# Patient Record
Sex: Female | Born: 1990 | Race: White | Hispanic: No | Marital: Married | Smoking: Former smoker
Health system: Southern US, Community
[De-identification: ages and names within clinical notes are randomized; demographics above are authoritative.]

## PROBLEM LIST (undated history)

## (undated) DIAGNOSIS — Z789 Other specified health status: Secondary | ICD-10-CM

## (undated) HISTORY — DX: Other specified health status: Z78.9

## (undated) HISTORY — PX: NO PAST SURGERIES: SHX2092

---
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
## 2015-01-06 ENCOUNTER — Encounter (HOSPITAL_COMMUNITY): Payer: Self-pay | Admitting: Emergency Medicine

## 2015-01-06 ENCOUNTER — Emergency Department (HOSPITAL_COMMUNITY)
Admission: EM | Admit: 2015-01-06 | Discharge: 2015-01-06 | Disposition: A | Discharge status: Home / Self Care | Payer: Medicaid Other | Attending: Emergency Medicine | Admitting: Emergency Medicine

## 2015-01-06 ENCOUNTER — Emergency Department (HOSPITAL_COMMUNITY): Payer: Medicaid Other

## 2015-01-06 DIAGNOSIS — Z720 Tobacco use: Secondary | ICD-10-CM | POA: Insufficient documentation

## 2015-01-06 DIAGNOSIS — Y9389 Activity, other specified: Secondary | ICD-10-CM | POA: Insufficient documentation

## 2015-01-06 DIAGNOSIS — S161XXA Strain of muscle, fascia and tendon at neck level, initial encounter: Secondary | ICD-10-CM

## 2015-01-06 DIAGNOSIS — Y998 Other external cause status: Secondary | ICD-10-CM | POA: Insufficient documentation

## 2015-01-06 DIAGNOSIS — Y92410 Unspecified street and highway as the place of occurrence of the external cause: Secondary | ICD-10-CM | POA: Insufficient documentation

## 2015-01-06 DIAGNOSIS — S0990XA Unspecified injury of head, initial encounter: Secondary | ICD-10-CM
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
## 2015-01-06 MED ORDER — IBUPROFEN 800 MG PO TABS
800.0000 mg | ORAL_TABLET | Freq: Once | ORAL | Status: AC
  Administered 2015-01-06: 800 mg via ORAL
  Filled 2015-01-06: qty 1

## 2015-01-06 MED ORDER — TRAMADOL HCL 50 MG PO TABS
50.0000 mg | ORAL_TABLET | Freq: Four times a day (QID) | ORAL | Status: DC | PRN
Start: 2015-01-06 — End: 2016-06-30

## 2015-01-06 MED ORDER — IBUPROFEN 600 MG PO TABS: 600.0000 mg | ORAL_TABLET | Freq: Four times a day (QID) | ORAL | Status: DC | PRN
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
## 2016-06-30 ENCOUNTER — Encounter (HOSPITAL_COMMUNITY): Payer: Self-pay | Admitting: Emergency Medicine

## 2016-06-30 ENCOUNTER — Emergency Department (HOSPITAL_COMMUNITY)
Admission: EM | Admit: 2016-06-30 | Discharge: 2016-06-30 | Disposition: A | Discharge status: Home / Self Care | Payer: Worker's Compensation | Attending: Emergency Medicine | Admitting: Emergency Medicine

## 2016-06-30 DIAGNOSIS — Z23 Encounter for immunization: Secondary | ICD-10-CM | POA: Insufficient documentation

## 2016-06-30 DIAGNOSIS — Y9389 Activity, other specified: Secondary | ICD-10-CM | POA: Insufficient documentation

## 2016-06-30 DIAGNOSIS — S61216A Laceration without foreign body of right little finger without damage to nail, initial encounter: Secondary | ICD-10-CM | POA: Diagnosis not present

## 2016-06-30 DIAGNOSIS — S6991XA Unspecified injury of right wrist, hand and finger(s), initial encounter: Secondary | ICD-10-CM | POA: Diagnosis present

## 2016-06-30 DIAGNOSIS — F17200 Nicotine dependence, unspecified, uncomplicated: Secondary | ICD-10-CM | POA: Diagnosis not present

## 2016-06-30 DIAGNOSIS — Y929 Unspecified place or not applicable: Secondary | ICD-10-CM | POA: Diagnosis not present

## 2016-06-30 DIAGNOSIS — Y990 Civilian activity done for income or pay: Secondary | ICD-10-CM | POA: Insufficient documentation

## 2016-06-30 DIAGNOSIS — W272XXA Contact with scissors, initial encounter: Secondary | ICD-10-CM | POA: Diagnosis not present

## 2016-06-30 MED ORDER — TETANUS-DIPHTH-ACELL PERTUSSIS 5-2.5-18.5 LF-MCG/0.5 IM SUSP
0.5000 mL | Freq: Once | INTRAMUSCULAR | Status: AC
  Administered 2016-06-30: 0.5 mL via INTRAMUSCULAR
  Filled 2016-06-30: qty 0.5

## 2016-06-30 MED ORDER — BACITRACIN ZINC 500 UNIT/GM EX OINT: 1.0000 "application " | TOPICAL_OINTMENT | Freq: Two times a day (BID) | CUTANEOUS | 1 refills | Status: DC

## 2016-06-30 NOTE — ED Notes (Signed)
Lab notified pt will need RDS after tx.
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
## 2016-06-30 NOTE — ED Triage Notes (Signed)
Per pt's supervisor pt needs drug testing after treatment.

## 2016-06-30 NOTE — ED Triage Notes (Signed)
Pt c/o laceration from scissors to the right pinky finger.

## 2017-09-17 ENCOUNTER — Telehealth: Payer: No Typology Code available for payment source | Admitting: Physician Assistant

## 2017-09-17 DIAGNOSIS — B9789 Other viral agents as the cause of diseases classified elsewhere: Secondary | ICD-10-CM

## 2017-09-17 DIAGNOSIS — J069 Acute upper respiratory infection, unspecified: Secondary | ICD-10-CM

## 2017-09-17 MED ORDER — BENZONATATE 100 MG PO CAPS: 100.0000 mg | ORAL_CAPSULE | Freq: Three times a day (TID) | ORAL | 0 refills | Status: DC | PRN

## 2017-09-17 NOTE — Progress Notes (Signed)

## 2017-12-26 ENCOUNTER — Ambulatory Visit (INDEPENDENT_AMBULATORY_CARE_PROVIDER_SITE_OTHER): Payer: 59 | Admitting: Adult Health

## 2017-12-26 ENCOUNTER — Other Ambulatory Visit: Payer: Self-pay

## 2017-12-26 ENCOUNTER — Encounter: Payer: Self-pay | Admitting: Adult Health

## 2017-12-26 VITALS — BP 124/82 | HR 83 | Ht 63.0 in | Wt 191.0 lb

## 2017-12-26 DIAGNOSIS — Z3201 Encounter for pregnancy test, result positive: Secondary | ICD-10-CM | POA: Diagnosis not present

## 2017-12-26 DIAGNOSIS — N926 Irregular menstruation, unspecified: Secondary | ICD-10-CM | POA: Diagnosis not present

## 2017-12-26 DIAGNOSIS — O3680X0 Pregnancy with inconclusive fetal viability, not applicable or unspecified: Secondary | ICD-10-CM | POA: Insufficient documentation

## 2017-12-26 DIAGNOSIS — Z3490 Encounter for supervision of normal pregnancy, unspecified, unspecified trimester: Secondary | ICD-10-CM

## 2017-12-26 LAB — POCT URINE PREGNANCY: Preg Test, Ur: POSITIVE — AB

## 2017-12-26 MED ORDER — PRENATAL PLUS IRON 29-1 MG PO TABS: 1.0000 | ORAL_TABLET | Freq: Every day | ORAL | 12 refills | Status: AC

## 2017-12-26 NOTE — Progress Notes (Signed)
  Subjective:     Patient ID: Norma Ramirez, female   DOB: 20-Jan-1991, 27 y.o.   MRN: [id number]  HPI Gema is a 27 year old white female in for UPT, has had +HPT.She works in Public affairs consultant at [employer]. She was planning cruise in October, would not go if pregnant then.   Review of Systems ?LMP had +HPT, last week Reviewed past medical,surgical, social and family history. Reviewed medications and allergies.     Objective:   Physical Exam BP 124/82 (BP Location: Right Arm, Patient Position: Sitting, Cuff Size: Normal)   Pulse 83   Ht  (1.6 m)   Wt 191 lb (86.6 kg)   LMP  (LMP Unknown)   BMI 33.83 kg/m UPT+, LMP, is unknown, Skin warm and dry. Neck: mid line trachea, normal thyroid, good ROM, no lymphadenopathy noted. Lungs: clear to ausculation bilaterally. Cardiovascular: regular rate and rhythm.Abdomen is soft and non tender, PHQ 2 score 0.Bedside US shows thickened endometrium, will check labs today.     Assessment:     1. Positive pregnancy test   2. Pregnancy, unspecified gestational age   45. Encounter to determine fetal viability of pregnancy, single or unspecified fetus       Plan:     Check QHCG and progesterone Return in 2 weeks for dating [place] Meds ordered this encounter  Medications  . Prenatal Vit-Iron Carbonyl-FA (PRENATAL PLUS IRON) 29-1 MG TABS    Sig: Take 1 tablet by mouth daily.    Dispense:  30 tablet    Refill:  12    Order Specific Question:   Supervising Provider    Answer:   Lazaro Arms [[id number]]  Review handouts on First trimester and by [facility]

## 2017-12-26 NOTE — Patient Instructions (Signed)
First Trimester of Pregnancy The first trimester of pregnancy is from week 1 until the end of week 13 (months 1 through 3). A week after a sperm fertilizes an egg, the egg will implant on the wall of the uterus. This embryo will begin to develop into a baby. Genes from you and your partner will form the baby. The female genes will determine whether the baby will be a boy or a girl. At 6-8 weeks, the eyes and face will be formed, and the heartbeat can be seen on ultrasound. At the end of 12 weeks, all the baby's organs will be formed. Now that you are pregnant, you will want to do everything you can to have a healthy baby. Two of the most important things are to get good prenatal care and to follow your health care provider's instructions. Prenatal care is all the medical care you receive before the baby's birth. This care will help prevent, find, and treat any problems during the pregnancy and childbirth. Body changes during your first trimester Your body goes through many changes during pregnancy. The changes vary from woman to woman.  You may gain or lose a couple of pounds at first.  You may feel sick to your stomach (nauseous) and you may throw up (vomit). If the vomiting is uncontrollable, call your health care provider.  You may tire easily.  You may develop headaches that can be relieved by medicines. All medicines should be approved by your health care provider.  You may urinate more often. Painful urination may mean you have a bladder infection.  You may develop heartburn as a result of your pregnancy.  You may develop constipation because certain hormones are causing the muscles that push stool through your intestines to slow down.  You may develop hemorrhoids or swollen veins (varicose veins).  Your breasts may begin to grow larger and become tender. Your nipples may stick out more, and the tissue that surrounds them (areola) may become darker.  Your gums may bleed and may be  sensitive to brushing and flossing.  Dark spots or blotches (chloasma, mask of pregnancy) may develop on your face. This will likely fade after the baby is born.  Your menstrual periods will stop.  You may have a loss of appetite.  You may develop cravings for certain kinds of food.  You may have changes in your emotions from day to day, such as being excited to be pregnant or being concerned that something may go wrong with the pregnancy and baby.  You may have more vivid and strange dreams.  You may have changes in your hair. These can include thickening of your hair, rapid growth, and changes in texture. Some women also have hair loss during or after pregnancy, or hair that feels dry or thin. Your hair will most likely return to normal after your baby is born.  What to expect at prenatal visits During a routine prenatal visit:  You will be weighed to make sure you and the baby are growing normally.  Your blood pressure will be taken.  Your abdomen will be measured to track your baby's growth.  The fetal heartbeat will be listened to between weeks 10 and 14 of your pregnancy.  Test results from any previous visits will be discussed.  Your health care provider may ask you:  How you are feeling.  If you are feeling the baby move.  If you have had any abnormal symptoms, such as leaking fluid, bleeding, severe headaches,   or abdominal cramping.  If you are using any tobacco products, including cigarettes, chewing tobacco, and electronic cigarettes.  If you have any questions.  Other tests that may be performed during your first trimester include:  Blood tests to find your blood type and to check for the presence of any previous infections. The tests will also be used to check for low iron levels (anemia) and protein on red blood cells (Rh antibodies). Depending on your risk factors, or if you previously had diabetes during pregnancy, you may have tests to check for high blood  sugar that affects pregnant women (gestational diabetes).  Urine tests to check for infections, diabetes, or protein in the urine.  An ultrasound to confirm the proper growth and development of the baby.  Fetal screens for spinal cord problems (spina bifida) and Down syndrome.  HIV (human immunodeficiency virus) testing. Routine prenatal testing includes screening for HIV, unless you choose not to have this test.  You may need other tests to make sure you and the baby are doing well.  Follow these instructions at home: Medicines  Follow your health care provider's instructions regarding medicine use. Specific medicines may be either safe or unsafe to take during pregnancy.  Take a prenatal vitamin that contains at least 600 micrograms (mcg) of folic acid.  If you develop constipation, try taking a stool softener if your health care provider approves. Eating and drinking  Eat a balanced diet that includes fresh fruits and vegetables, whole grains, good sources of protein such as meat, eggs, or tofu, and low-fat dairy. Your health care provider will help you determine the amount of weight gain that is right for you.  Avoid raw meat and uncooked cheese. These carry germs that can cause birth defects in the baby.  Eating four or five small meals rather than three large meals a day may help relieve nausea and vomiting. If you start to feel nauseous, eating a few soda crackers can be helpful. Drinking liquids between meals, instead of during meals, also seems to help ease nausea and vomiting.  Limit foods that are high in fat and processed sugars, such as fried and sweet foods.  To prevent constipation: ? Eat foods that are high in fiber, such as fresh fruits and vegetables, whole grains, and beans. ? Drink enough fluid to keep your urine clear or pale yellow. Activity  Exercise only as directed by your health care provider. Most women can continue their usual exercise routine during  pregnancy. Try to exercise for 30 minutes at least 5 days a week. Exercising will help you: ? Control your weight. ? Stay in shape. ? Be prepared for labor and delivery.  Experiencing pain or cramping in the lower abdomen or lower back is a good sign that you should stop exercising. Check with your health care provider before continuing with normal exercises.  Try to avoid standing for long periods of time. Move your legs often if you must stand in one place for a long time.  Avoid heavy lifting.  Wear low-heeled shoes and practice good posture.  You may continue to have sex unless your health care provider tells you not to. Relieving pain and discomfort  Wear a good support bra to relieve breast tenderness.  Take warm sitz baths to soothe any pain or discomfort caused by hemorrhoids. Use hemorrhoid cream if your health care provider approves.  Rest with your legs elevated if you have leg cramps or low back pain.  If you develop   varicose veins in your legs, wear support hose. Elevate your feet for 15 minutes, 3-4 times a day. Limit salt in your diet. Prenatal care  Schedule your prenatal visits by the twelfth week of pregnancy. They are usually scheduled monthly at first, then more often in the last 2 months before delivery.  Write down your questions. Take them to your prenatal visits.  Keep all your prenatal visits as told by your health care provider. This is important. Safety  Wear your seat belt at all times when driving.  Make a list of emergency phone numbers, including numbers for family, friends, the hospital, and police and fire departments. General instructions  Ask your health care provider for a referral to a local prenatal education class. Begin classes no later than the beginning of month 6 of your pregnancy.  Ask for help if you have counseling or nutritional needs during pregnancy. Your health care provider can offer advice or refer you to specialists for help  with various needs.  Do not use hot tubs, steam rooms, or saunas.  Do not douche or use tampons or scented sanitary pads.  Do not cross your legs for long periods of time.  Avoid cat litter boxes and soil used by cats. These carry germs that can cause birth defects in the baby and possibly loss of the fetus by miscarriage or stillbirth.  Avoid all smoking, herbs, alcohol, and medicines not prescribed by your health care provider. Chemicals in these products affect the formation and growth of the baby.  Do not use any products that contain nicotine or tobacco, such as cigarettes and e-cigarettes. If you need help quitting, ask your health care provider. You may receive counseling support and other resources to help you quit.  Schedule a dentist appointment. At home, brush your teeth with a soft toothbrush and be gentle when you floss. Contact a health care provider if:  You have dizziness.  You have mild pelvic cramps, pelvic pressure, or nagging pain in the abdominal area.  You have persistent nausea, vomiting, or diarrhea.  You have a bad smelling vaginal discharge.  You have pain when you urinate.  You notice increased swelling in your face, hands, legs, or ankles.  You are exposed to fifth disease or chickenpox.  You are exposed to [place] measles (rubella) and have never had it. Get help right away if:  You have a fever.  You are leaking fluid from your vagina.  You have spotting or bleeding from your vagina.  You have severe abdominal cramping or pain.  You have rapid weight gain or loss.  You vomit blood or material that looks like coffee grounds.  You develop a severe headache.  You have shortness of breath.  You have any kind of trauma, such as from a fall or a car accident. Summary  The first trimester of pregnancy is from week 1 until the end of week 13 (months 1 through 3).  Your body goes through many changes during pregnancy. The changes vary from  woman to woman.  You will have routine prenatal visits. During those visits, your health care provider will examine you, discuss any test results you may have, and talk with you about how you are feeling. This information is not intended to replace advice given to you by your health care provider. Make sure you discuss any questions you have with your health care provider. Document Released: 08/03/2001 Document Revised: 07/21/2016 Document Reviewed: 07/21/2016 [employer] Interactive Patient Education  2018 [employer]  

## 2017-12-27 LAB — PROGESTERONE: PROGESTERONE: 13.3 ng/mL

## 2017-12-27 LAB — BETA HCG QUANT (REF LAB): hCG Quant: 1580 m[IU]/mL

## 2018-01-09 ENCOUNTER — Ambulatory Visit (INDEPENDENT_AMBULATORY_CARE_PROVIDER_SITE_OTHER): Payer: 59

## 2018-01-09 DIAGNOSIS — Z3A01 Less than 8 weeks gestation of pregnancy: Secondary | ICD-10-CM

## 2018-01-09 DIAGNOSIS — O3680X0 Pregnancy with inconclusive fetal viability, not applicable or unspecified: Secondary | ICD-10-CM | POA: Diagnosis not present

## 2018-01-09 NOTE — Progress Notes (Signed)
[place] 6+6 wks,single IUP w/ys,positive fht 120 bpm,normal ovaries bilat,crl 9.17 mm,EDD 08/29/2018

## 2018-01-23 ENCOUNTER — Encounter: Payer: Self-pay | Admitting: Women's Health

## 2018-01-23 ENCOUNTER — Ambulatory Visit: Payer: 59 | Admitting: *Deleted

## 2018-01-23 ENCOUNTER — Ambulatory Visit (INDEPENDENT_AMBULATORY_CARE_PROVIDER_SITE_OTHER): Payer: 59 | Admitting: Women's Health

## 2018-01-23 VITALS — BP 112/75 | HR 82 | Wt 187.0 lb

## 2018-01-23 DIAGNOSIS — Z3401 Encounter for supervision of normal first pregnancy, first trimester: Secondary | ICD-10-CM

## 2018-01-23 DIAGNOSIS — Z331 Pregnant state, incidental: Secondary | ICD-10-CM

## 2018-01-23 DIAGNOSIS — Z1389 Encounter for screening for other disorder: Secondary | ICD-10-CM

## 2018-01-23 DIAGNOSIS — Z3682 Encounter for antenatal screening for nuchal translucency: Secondary | ICD-10-CM

## 2018-01-23 DIAGNOSIS — Z3A08 8 weeks gestation of pregnancy: Secondary | ICD-10-CM | POA: Diagnosis not present

## 2018-01-23 DIAGNOSIS — Z3400 Encounter for supervision of normal first pregnancy, unspecified trimester: Secondary | ICD-10-CM | POA: Insufficient documentation

## 2018-01-23 LAB — POCT URINALYSIS DIPSTICK
Glucose, UA: NEGATIVE
Ketones, UA: NEGATIVE
Leukocytes, UA: NEGATIVE
Nitrite, UA: NEGATIVE
Protein, UA: NEGATIVE
RBC UA: NEGATIVE

## 2018-01-23 NOTE — Patient Instructions (Signed)
Norma Ramirez, I greatly value your feedback.  If you receive a survey following your visit with us today, we appreciate you taking the time to fill it out.  Thanks, Joellyn HaffKim Juvia Aerts, CNM, WHNP-BC   Nausea & Vomiting  Have saltine crackers or pretzels by your bed and eat a few bites before you raise your head out of bed in the morning  Eat small frequent meals throughout the day instead of large meals  Drink plenty of fluids throughout the day to stay hydrated, just don't drink a lot of fluids with your meals.  This can make your stomach fill up faster making you feel sick  Do not brush your teeth right after you eat  Products with real ginger are good for nausea, like ginger ale and ginger hard candy Make sure it says made with real ginger!  Sucking on sour candy like lemon heads is also good for nausea  If your prenatal vitamins make you nauseated, take them at night so you will sleep through the nausea  Sea Bands  If you feel like you need medicine for the nausea & vomiting please let us know  If you are unable to keep any fluids or food down please let us know   Constipation  Drink plenty of fluid, preferably water, throughout the day  Eat foods high in fiber such as fruits, vegetables, and grains  Exercise, such as walking, is a good way to keep your bowels regular  Drink warm fluids, especially warm prune juice, or decaf coffee  Eat a 1/2 cup of real oatmeal (not instant), 1/2 cup applesauce, and 1/2-1 cup warm prune juice every day  If needed, you may take Colace (docusate sodium) stool softener once or twice a day to help keep the stool soft. If you are pregnant, wait until you are out of your first trimester (12-14 weeks of pregnancy)  If you still are having problems with constipation, you may take Miralax once daily as needed to help keep your bowels regular.  If you are pregnant, wait until you are out of your first trimester (12-14 weeks of pregnancy)   First  Trimester of Pregnancy The first trimester of pregnancy is from week 1 until the end of week 12 (months 1 through 3). A week after a sperm fertilizes an egg, the egg will implant on the wall of the uterus. This embryo will begin to develop into a baby. Genes from you and your partner are forming the baby. The female genes determine whether the baby is a boy or a girl. At 6-8 weeks, the eyes and face are formed, and the heartbeat can be seen on ultrasound. At the end of 12 weeks, all the baby's organs are formed.  Now that you are pregnant, you will want to do everything you can to have a healthy baby. Two of the most important things are to get good prenatal care and to follow your health care provider's instructions. Prenatal care is all the medical care you receive before the baby's birth. This care will help prevent, find, and treat any problems during the pregnancy and childbirth. BODY CHANGES Your body goes through many changes during pregnancy. The changes vary from woman to woman.   You may gain or lose a couple of pounds at first.  You may feel sick to your stomach (nauseous) and throw up (vomit). If the vomiting is uncontrollable, call your health care provider.  You may tire easily.  You may develop headaches  that can be relieved by medicines approved by your health care provider.  You may urinate more often. Painful urination may mean you have a bladder infection.  You may develop heartburn as a result of your pregnancy.  You may develop constipation because certain hormones are causing the muscles that push waste through your intestines to slow down.  You may develop hemorrhoids or swollen, bulging veins (varicose veins).  Your breasts may begin to grow larger and become tender. Your nipples may stick out more, and the tissue that surrounds them (areola) may become darker.  Your gums may bleed and may be sensitive to brushing and flossing.  Dark spots or blotches (chloasma, mask  of pregnancy) may develop on your face. This will likely fade after the baby is born.  Your menstrual periods will stop.  You may have a loss of appetite.  You may develop cravings for certain kinds of food.  You may have changes in your emotions from day to day, such as being excited to be pregnant or being concerned that something may go wrong with the pregnancy and baby.  You may have more vivid and strange dreams.  You may have changes in your hair. These can include thickening of your hair, rapid growth, and changes in texture. Some women also have hair loss during or after pregnancy, or hair that feels dry or thin. Your hair will most likely return to normal after your baby is born. WHAT TO EXPECT AT YOUR PRENATAL VISITS During a routine prenatal visit:  You will be weighed to make sure you and the baby are growing normally.  Your blood pressure will be taken.  Your abdomen will be measured to track your baby's growth.  The fetal heartbeat will be listened to starting around week 10 or 12 of your pregnancy.  Test results from any previous visits will be discussed. Your health care provider may ask you:  How you are feeling.  If you are feeling the baby move.  If you have had any abnormal symptoms, such as leaking fluid, bleeding, severe headaches, or abdominal cramping.  If you have any questions. Other tests that may be performed during your first trimester include:  Blood tests to find your blood type and to check for the presence of any previous infections. They will also be used to check for low iron levels (anemia) and Rh antibodies. Later in the pregnancy, blood tests for diabetes will be done along with other tests if problems develop.  Urine tests to check for infections, diabetes, or protein in the urine.  An ultrasound to confirm the proper growth and development of the baby.  An amniocentesis to check for possible genetic problems.  Fetal screens for spina  bifida and Down syndrome.  You may need other tests to make sure you and the baby are doing well. HOME CARE INSTRUCTIONS  Medicines  Follow your health care provider's instructions regarding medicine use. Specific medicines may be either safe or unsafe to take during pregnancy.  Take your prenatal vitamins as directed.  If you develop constipation, try taking a stool softener if your health care provider approves. Diet  Eat regular, well-balanced meals. Choose a variety of foods, such as meat or vegetable-based protein, fish, milk and low-fat dairy products, vegetables, fruits, and whole grain breads and cereals. Your health care provider will help you determine the amount of weight gain that is right for you.  Avoid raw meat and uncooked cheese. These carry germs that can  cause birth defects in the baby.  Eating four or five small meals rather than three large meals a day may help relieve nausea and vomiting. If you start to feel nauseous, eating a few soda crackers can be helpful. Drinking liquids between meals instead of during meals also seems to help nausea and vomiting.  If you develop constipation, eat more high-fiber foods, such as fresh vegetables or fruit and whole grains. Drink enough fluids to keep your urine clear or pale yellow. Activity and Exercise  Exercise only as directed by your health care provider. Exercising will help you:  Control your weight.  Stay in shape.  Be prepared for labor and delivery.  Experiencing pain or cramping in the lower abdomen or low back is a good sign that you should stop exercising. Check with your health care provider before continuing normal exercises.  Try to avoid standing for long periods of time. Move your legs often if you must stand in one place for a long time.  Avoid heavy lifting.  Wear low-heeled shoes, and practice good posture.  You may continue to have sex unless your health care provider directs you  otherwise. Relief of Pain or Discomfort  Wear a good support bra for breast tenderness.   Take warm sitz baths to soothe any pain or discomfort caused by hemorrhoids. Use hemorrhoid cream if your health care provider approves.   Rest with your legs elevated if you have leg cramps or low back pain.  If you develop varicose veins in your legs, wear support hose. Elevate your feet for 15 minutes, 3-4 times a day. Limit salt in your diet. Prenatal Care  Schedule your prenatal visits by the twelfth week of pregnancy. They are usually scheduled monthly at first, then more often in the last 2 months before delivery.  Write down your questions. Take them to your prenatal visits.  Keep all your prenatal visits as directed by your health care provider. Safety  Wear your seat belt at all times when driving.  Make a list of emergency phone numbers, including numbers for family, friends, the hospital, and police and fire departments. General Tips  Ask your health care provider for a referral to a local prenatal education class. Begin classes no later than at the beginning of month 6 of your pregnancy.  Ask for help if you have counseling or nutritional needs during pregnancy. Your health care provider can offer advice or refer you to specialists for help with various needs.  Do not use hot tubs, steam rooms, or saunas.  Do not douche or use tampons or scented sanitary pads.  Do not cross your legs for long periods of time.  Avoid cat litter boxes and soil used by cats. These carry germs that can cause birth defects in the baby and possibly loss of the fetus by miscarriage or stillbirth.  Avoid all smoking, herbs, alcohol, and medicines not prescribed by your health care provider. Chemicals in these affect the formation and growth of the baby.  Schedule a dentist appointment. At home, brush your teeth with a soft toothbrush and be gentle when you floss. SEEK MEDICAL CARE IF:   You have  dizziness.  You have mild pelvic cramps, pelvic pressure, or nagging pain in the abdominal area.  You have persistent nausea, vomiting, or diarrhea.  You have a bad smelling vaginal discharge.  You have pain with urination.  You notice increased swelling in your face, hands, legs, or ankles. SEEK IMMEDIATE MEDICAL CARE IF:  You have a fever.  You are leaking fluid from your vagina.  You have spotting or bleeding from your vagina.  You have severe abdominal cramping or pain.  You have rapid weight gain or loss.  You vomit blood or material that looks like coffee grounds.  You are exposed to [place] measles and have never had them.  You are exposed to fifth disease or chickenpox.  You develop a severe headache.  You have shortness of breath.  You have any kind of trauma, such as from a fall or a car accident. Document Released: 08/03/2001 Document Revised: 12/24/2013 Document Reviewed: 06/19/2013 [facility] Patient Information 2015 [place], [place]. This information is not intended to replace advice given to you by your health care provider. Make sure you discuss any questions you have with your health care provider.

## 2018-01-23 NOTE — Progress Notes (Signed)
INITIAL OBSTETRICAL VISIT Patient name: Norma Ramirez MRN [id number]  Date of birth: 13-Jan-1991 Chief Complaint:   Initial Prenatal Visit  History of Present Illness:   Norma Ramirez is a 27 y.o. G1P0 Caucasian female at 3874w6d by 6wk u/s, with an Estimated Date of Delivery: 08/29/18 being seen today for her initial obstetrical visit.   Her obstetrical history is significant for primigravida.   Today she reports no complaints.  No LMP recorded (lmp unknown). Patient is pregnant. Last pap 02/17/17. Results were: normal Review of Systems:   Pertinent items are noted in HPI Denies cramping/contractions, leakage of fluid, vaginal bleeding, abnormal vaginal discharge w/ itching/odor/irritation, headaches, visual changes, shortness of breath, chest pain, abdominal pain, severe nausea/vomiting, or problems with urination or bowel movements unless otherwise stated above.  Pertinent History Reviewed:  Reviewed past medical,surgical, social, obstetrical and family history.  Reviewed problem list, medications and allergies. OB History  Gravida Para Term Preterm AB Living  1            SAB TAB Ectopic Multiple Live Births               # Outcome Date GA Lbr Len/2nd Weight Sex Delivery Anes PTL Lv  1 Current            Physical Assessment:   Vitals:   01/23/18 1017  BP: 112/75  Pulse: 82  Weight: 187 lb (84.8 kg)  Body mass index is 33.13 kg/m.       Physical Examination:  General appearance - well appearing, and in no distress  Mental status - alert, oriented to person, place, and time  Psych:  She has a normal mood and affect  Skin - warm and dry, normal color, no suspicious lesions noted  Chest - effort normal, all lung fields clear to auscultation bilaterally  Heart - normal rate and regular rhythm  Abdomen - soft, nontender  Extremities:  No swelling or varicosities noted  Thin prep pap is not done  Fetal Heart Rate (bpm): +u/s via informal transabdominal u/s  Results  for orders placed or performed in visit on 01/23/18 (from the past 24 hour(s))  POCT urinalysis dipstick   Collection Time: 01/23/18 10:37 AM  Result Value Ref Range   Color, UA     Clarity, UA     Glucose, UA Negative Negative   Bilirubin, UA     Ketones, UA neg    Spec Grav, UA  1.010 - 1.025   Blood, UA neg    pH, UA  5.0 - 8.0   Protein, UA Negative Negative   Urobilinogen, UA  0.2 or 1.0 E.U./dL   Nitrite, UA neg    Leukocytes, UA Negative Negative   Appearance     Odor      Assessment & Plan:  1) Low-Risk Pregnancy G1P0 at 3774w6d with an Estimated Date of Delivery: 08/29/18   2) Initial OB visit  3) BabyScripts optimized pt  Meds: No orders of the defined types were placed in this encounter.   Initial labs obtained Continue prenatal vitamins Reviewed n/v relief measures and warning s/s to report Reviewed recommended weight gain based on pre-gravid BMI Encouraged well-balanced diet Genetic Screening discussed Integrated Screen: requested Cystic fibrosis screening discussed declined Ultrasound discussed; fetal survey: requested CCNC completed>plans to apply for preg mcaid  Follow-up: Return in about 1 month (around 02/21/2018) for LROB, US:NT+1stIT.   Orders Placed This Encounter  Procedures  . Urine Culture  . GC/Chlamydia Probe  Amp  . US Fetal Nuchal Translucency Measurement  . Obstetric Panel, Including HIV  . Urinalysis, Routine w reflex microscopic  . POCT urinalysis dipstick    Cheral Marker CNM, [facility] 01/23/2018 11:24 AM

## 2018-01-24 LAB — OBSTETRIC PANEL, INCLUDING HIV
Antibody Screen: NEGATIVE
BASOS ABS: 0 10*3/uL (ref 0.0–0.2)
Basos: 0 %
EOS (ABSOLUTE): 0.1 10*3/uL (ref 0.0–0.4)
Eos: 1 %
HEP B S AG: NEGATIVE
HIV Screen 4th Generation wRfx: NONREACTIVE
Hematocrit: 37 % (ref 34.0–46.6)
Hemoglobin: 12.3 g/dL (ref 11.1–15.9)
IMMATURE GRANULOCYTES: 0 %
Immature Grans (Abs): 0 10*3/uL (ref 0.0–0.1)
LYMPHS ABS: 1.1 10*3/uL (ref 0.7–3.1)
Lymphs: 16 %
MCH: 28.8 pg (ref 26.6–33.0)
MCHC: 33.2 g/dL (ref 31.5–35.7)
MCV: 87 fL (ref 79–97)
MONOCYTES: 11 %
Monocytes Absolute: 0.7 10*3/uL (ref 0.1–0.9)
Neutrophils Absolute: 4.7 10*3/uL (ref 1.4–7.0)
Neutrophils: 72 %
PLATELETS: 281 10*3/uL (ref 150–450)
RBC: 4.27 x10E6/uL (ref 3.77–5.28)
RDW: 13.5 % (ref 12.3–15.4)
RPR: NONREACTIVE
Rh Factor: POSITIVE
Rubella Antibodies, IGG: 3.08 index (ref >0.99)
WBC: 6.7 10*3/uL (ref 3.4–10.8)

## 2018-01-24 LAB — URINALYSIS, ROUTINE W REFLEX MICROSCOPIC
Bilirubin, UA: NEGATIVE
GLUCOSE, UA: NEGATIVE
Ketones, UA: NEGATIVE
Leukocytes, UA: NEGATIVE
Nitrite, UA: NEGATIVE
PH UA: 8.5 — AB (ref 5.0–7.5)
PROTEIN UA: NEGATIVE
RBC, UA: NEGATIVE
Specific Gravity, UA: 1.015 (ref 1.005–1.030)
UUROB: 0.2 mg/dL (ref 0.2–1.0)

## 2018-01-25 LAB — GC/CHLAMYDIA PROBE AMP
Chlamydia trachomatis, NAA: NEGATIVE
Neisseria gonorrhoeae by PCR: NEGATIVE

## 2018-01-25 LAB — URINE CULTURE

## 2018-01-30 ENCOUNTER — Telehealth: Payer: Self-pay | Admitting: *Deleted

## 2018-01-30 NOTE — Telephone Encounter (Signed)
LMOVM f/u if she had received mommy kit.

## 2018-01-31 DIAGNOSIS — Z3400 Encounter for supervision of normal first pregnancy, unspecified trimester: Secondary | ICD-10-CM

## 2018-02-22 ENCOUNTER — Ambulatory Visit (INDEPENDENT_AMBULATORY_CARE_PROVIDER_SITE_OTHER): Payer: 59 | Admitting: Advanced Practice Midwife

## 2018-02-22 ENCOUNTER — Ambulatory Visit (INDEPENDENT_AMBULATORY_CARE_PROVIDER_SITE_OTHER): Payer: 59

## 2018-02-22 ENCOUNTER — Encounter: Payer: Self-pay | Admitting: Advanced Practice Midwife

## 2018-02-22 VITALS — BP 117/74 | HR 73 | Wt 187.0 lb

## 2018-02-22 DIAGNOSIS — Z3682 Encounter for antenatal screening for nuchal translucency: Secondary | ICD-10-CM | POA: Diagnosis not present

## 2018-02-22 DIAGNOSIS — Z1389 Encounter for screening for other disorder: Secondary | ICD-10-CM

## 2018-02-22 DIAGNOSIS — Z363 Encounter for antenatal screening for malformations: Secondary | ICD-10-CM

## 2018-02-22 DIAGNOSIS — Z3A13 13 weeks gestation of pregnancy: Secondary | ICD-10-CM | POA: Diagnosis not present

## 2018-02-22 DIAGNOSIS — Z331 Pregnant state, incidental: Secondary | ICD-10-CM | POA: Diagnosis not present

## 2018-02-22 DIAGNOSIS — Z3401 Encounter for supervision of normal first pregnancy, first trimester: Secondary | ICD-10-CM | POA: Diagnosis not present

## 2018-02-22 LAB — POCT URINALYSIS DIPSTICK
Blood, UA: NEGATIVE
GLUCOSE UA: NEGATIVE
Ketones, UA: NEGATIVE
LEUKOCYTES UA: NEGATIVE
Nitrite, UA: NEGATIVE
PROTEIN UA: NEGATIVE

## 2018-02-22 NOTE — Progress Notes (Signed)
  G1P0 7272w1d Estimated Date of Delivery: 08/29/18  Blood pressure 117/74, pulse 73, weight 187 lb (84.8 kg).   BP weight and urine results all reviewed and noted.  Please refer to the obstetrical flow sheet for the fundal height and fetal heart rate documentation:  Patient denies any bleeding and no rupture of membranes symptoms or regular contractions. 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   Physical Assessment:   Vitals:   02/22/18 1041  BP: 117/74  Pulse: 73  Weight: 187 lb (84.8 kg)  Body mass index is 33.13 kg/m.        Physical Examination:   General appearance: Well appearing, and in no distress  Mental status: Alert, oriented to person, place, and time  Skin: Warm & dry  Cardiovascular: Normal heart rate noted  Respiratory: Normal respiratory effort, no distress  Abdomen: Soft, gravid, nontender  Pelvic: Cervical exam deferred         Extremities: Edema: Trace  Fetal Status: Fetal Heart Rate (bpm): 158        Results for orders placed or performed in visit on 02/22/18 (from the past 24 hour(s))  POCT urinalysis dipstick   Collection Time: 02/22/18 10:42 AM  Result Value Ref Range   Color, UA     Clarity, UA     Glucose, UA Negative Negative   Bilirubin, UA     Ketones, UA neg    Spec Grav, UA  1.010 - 1.025   Blood, UA neg    pH, UA  5.0 - 8.0   Protein, UA Negative Negative   Urobilinogen, UA  0.2 or 1.0 E.U./dL   Nitrite, UA neg    Leukocytes, UA Negative Negative   Appearance     Odor       US 13+1 wks,measurements c/w dates,fhr 158 bpm,normal ovaries bilat,crl 68.86 mm,NB present,NT 1.5 mm,posterior pl gr 0    Orders Placed This Encounter  Procedures  . US OB Comp + 14 Wk  . Integrated 1  . POCT urinalysis dipstick    Plan:  Continued routine obstetrical care, kinesiology taping for hip/back/leg pain  Return in about 5 weeks (around 03/29/2018) for LROB,  WG:NFAOZHYS:Anatomy.

## 2018-02-22 NOTE — Progress Notes (Signed)
US 13+1 wks,measurements c/w dates,fhr 158 bpm,normal ovaries bilat,crl 68.86 mm,NB present,NT 1.5 mm,posterior pl gr 0

## 2018-02-22 NOTE — Patient Instructions (Addendum)
Norma PilotMargaret R Ramirez, I greatly value your feedback.  If you receive a survey following your visit with us today, we appreciate you taking the time to fill it out.  Thanks, Norma BeamsFran Ramirez, CNM     Second Trimester of Pregnancy The second trimester is from week 14 through week 27 (months 4 through 6). The second trimester is often a time when you feel your best. Your body has adjusted to being pregnant, and you begin to feel better physically. Usually, morning sickness has lessened or quit completely, you may have more energy, and you may have an increase in appetite. The second trimester is also a time when the fetus is growing rapidly. At the end of the sixth month, the fetus is about 9 inches long and weighs about 1 pounds. You will likely begin to feel the baby move (quickening) between 16 and 20 weeks of pregnancy. Body changes during your second trimester Your body continues to go through many changes during your second trimester. The changes vary from woman to woman.  Your weight will continue to increase. You will notice your lower abdomen bulging out.  You may begin to get stretch marks on your hips, abdomen, and breasts.  You may develop headaches that can be relieved by medicines. The medicines should be approved by your health care provider.  You may urinate more often because the fetus is pressing on your bladder.  You may develop or continue to have heartburn as a result of your pregnancy.  You may develop constipation because certain hormones are causing the muscles that push waste through your intestines to slow down.  You may develop hemorrhoids or swollen, bulging veins (varicose veins).  You may have back pain. This is caused by: ? Weight gain. ? Pregnancy hormones that are relaxing the joints in your pelvis. ? A shift in weight and the muscles that support your balance.  Your breasts will continue to grow and they will continue to become tender.  Your gums may  bleed and may be sensitive to brushing and flossing.  Dark spots or blotches (chloasma, mask of pregnancy) may develop on your face. This will likely fade after the baby is born.  A dark line from your belly button to the pubic area (linea nigra) may appear. This will likely fade after the baby is born.  You may have changes in your hair. These can include thickening of your hair, rapid growth, and changes in texture. Some women also have hair loss during or after pregnancy, or hair that feels dry or thin. Your hair will most likely return to normal after your baby is born.  What to expect at prenatal visits During a routine prenatal visit:  You will be weighed to make sure you and the fetus are growing normally.  Your blood pressure will be taken.  Your abdomen will be measured to track your baby's growth.  The fetal heartbeat will be listened to.  Any test results from the previous visit will be discussed.  Your health care provider may ask you:  How you are feeling.  If you are feeling the baby move.  If you have had any abnormal symptoms, such as leaking fluid, bleeding, severe headaches, or abdominal cramping.  If you are using any tobacco products, including cigarettes, chewing tobacco, and electronic cigarettes.  If you have any questions.  Other tests that may be performed during your second trimester include:  Blood tests that check for: ? Low iron levels (anemia). ?  High blood sugar that affects pregnant women (gestational diabetes) between 20 and 28 weeks. ? Rh antibodies. This is to check for a protein on red blood cells (Rh factor).  Urine tests to check for infections, diabetes, or protein in the urine.  An ultrasound to confirm the proper growth and development of the baby.  An amniocentesis to check for possible genetic problems.  Fetal screens for spina bifida and Down syndrome.  HIV (human immunodeficiency virus) testing. Routine prenatal testing  includes screening for HIV, unless you choose not to have this test.  Follow these instructions at home: Medicines  Follow your health care provider's instructions regarding medicine use. Specific medicines may be either safe or unsafe to take during pregnancy.  Take a prenatal vitamin that contains at least 600 micrograms (mcg) of folic acid.  If you develop constipation, try taking a stool softener if your health care provider approves. Eating and drinking  Eat a balanced diet that includes fresh fruits and vegetables, whole grains, good sources of protein such as meat, eggs, or tofu, and low-fat dairy. Your health care provider will help you determine the amount of weight gain that is right for you.  Avoid raw meat and uncooked cheese. These carry germs that can cause birth defects in the baby.  If you have low calcium intake from food, talk to your health care provider about whether you should take a daily calcium supplement.  Limit foods that are high in fat and processed sugars, such as fried and sweet foods.  To prevent constipation: ? Drink enough fluid to keep your urine clear or pale yellow. ? Eat foods that are high in fiber, such as fresh fruits and vegetables, whole grains, and beans. Activity  Exercise only as directed by your health care provider. Most women can continue their usual exercise routine during pregnancy. Try to exercise for 30 minutes at least 5 days a week. Stop exercising if you experience uterine contractions.  Avoid heavy lifting, wear low heel shoes, and practice good posture.  A sexual relationship may be continued unless your health care provider directs you otherwise. Relieving pain and discomfort  Wear a good support bra to prevent discomfort from breast tenderness.  Take warm sitz baths to soothe any pain or discomfort caused by hemorrhoids. Use hemorrhoid cream if your health care provider approves.  Rest with your legs elevated if you have  leg cramps or low back pain.  If you develop varicose veins, wear support hose. Elevate your feet for 15 minutes, 3-4 times a day. Limit salt in your diet. Prenatal Care  Write down your questions. Take them to your prenatal visits.  Keep all your prenatal visits as told by your health care provider. This is important. Safety  Wear your seat belt at all times when driving.  Make a list of emergency phone numbers, including numbers for family, friends, the hospital, and police and fire departments. General instructions  Ask your health care provider for a referral to a local prenatal education class. Begin classes no later than the beginning of month 6 of your pregnancy.  Ask for help if you have counseling or nutritional needs during pregnancy. Your health care provider can offer advice or refer you to specialists for help with various needs.  Do not use hot tubs, steam rooms, or saunas.  Do not douche or use tampons or scented sanitary pads.  Do not cross your legs for long periods of time.  Avoid cat litter boxes and  soil used by cats. These carry germs that can cause birth defects in the baby and possibly loss of the fetus by miscarriage or stillbirth.  Avoid all smoking, herbs, alcohol, and unprescribed drugs. Chemicals in these products can affect the formation and growth of the baby.  Do not use any products that contain nicotine or tobacco, such as cigarettes and e-cigarettes. If you need help quitting, ask your health care provider.  Visit your dentist if you have not gone yet during your pregnancy. Use a soft toothbrush to brush your teeth and be gentle when you floss. Contact a health care provider if:  You have dizziness.  You have mild pelvic cramps, pelvic pressure, or nagging pain in the abdominal area.  You have persistent nausea, vomiting, or diarrhea.  You have a bad smelling vaginal discharge.  You have pain when you urinate. Get help right away if:  You  have a fever.  You are leaking fluid from your vagina.  You have spotting or bleeding from your vagina.  You have severe abdominal cramping or pain.  You have rapid weight gain or weight loss.  You have shortness of breath with chest pain.  You notice sudden or extreme swelling of your face, hands, ankles, feet, or legs.  You have not felt your baby move in over an hour.  You have severe headaches that do not go away when you take medicine.  You have vision changes. Summary  The second trimester is from week 14 through week 27 (months 4 through 6). It is also a time when the fetus is growing rapidly.  Your body goes through many changes during pregnancy. The changes vary from woman to woman.  Avoid all smoking, herbs, alcohol, and unprescribed drugs. These chemicals affect the formation and growth your baby.  Do not use any tobacco products, such as cigarettes, chewing tobacco, and e-cigarettes. If you need help quitting, ask your health care provider.  Contact your health care provider if you have any questions. Keep all prenatal visits as told by your health care provider. This is important. This information is not intended to replace advice given to you by your health care provider. Make sure you discuss any questions you have with your health care provider.      CHILDBIRTH CLASSES [phone] is the phone number for Pregnancy Classes or hospital tours at [place] will be referred to  [email or web address] for more information on childbirth classes  At this site you may register for classes. You may sign up for a waiting list if classes are full. Please SIGN UP FOR THIS!.   When the waiting list becomes long, sometimes new classes can be added.   Kinesiology taping for pregnancy:  Youtube has good vidoes of "how tos" for lower back, pelvic, hip pain; swelling of feet,  etc

## 2018-03-21 ENCOUNTER — Encounter: Payer: Self-pay | Admitting: *Deleted

## 2018-03-29 ENCOUNTER — Ambulatory Visit (INDEPENDENT_AMBULATORY_CARE_PROVIDER_SITE_OTHER): Payer: 59

## 2018-03-29 ENCOUNTER — Other Ambulatory Visit: Payer: Self-pay

## 2018-03-29 ENCOUNTER — Ambulatory Visit (INDEPENDENT_AMBULATORY_CARE_PROVIDER_SITE_OTHER): Payer: 59 | Admitting: Advanced Practice Midwife

## 2018-03-29 ENCOUNTER — Encounter: Payer: Self-pay | Admitting: Advanced Practice Midwife

## 2018-03-29 VITALS — BP 116/68 | HR 73 | Wt 185.5 lb

## 2018-03-29 DIAGNOSIS — Z3402 Encounter for supervision of normal first pregnancy, second trimester: Secondary | ICD-10-CM

## 2018-03-29 DIAGNOSIS — Z363 Encounter for antenatal screening for malformations: Secondary | ICD-10-CM | POA: Diagnosis not present

## 2018-03-29 DIAGNOSIS — Z3A18 18 weeks gestation of pregnancy: Secondary | ICD-10-CM | POA: Diagnosis not present

## 2018-03-29 DIAGNOSIS — Z331 Pregnant state, incidental: Secondary | ICD-10-CM

## 2018-03-29 DIAGNOSIS — Z1389 Encounter for screening for other disorder: Secondary | ICD-10-CM

## 2018-03-29 DIAGNOSIS — Z1379 Encounter for other screening for genetic and chromosomal anomalies: Secondary | ICD-10-CM

## 2018-03-29 NOTE — Progress Notes (Signed)
   LOW-RISK PREGNANCY VISIT Patient name: Norma Ramirez MRN [id number]  Date of birth: 01/04/1991 Chief Complaint:   Routine Prenatal Visit (u/s today)  History of Present Illness:   Norma Ramirez is a 27 y.o. G1P0 female at 6829w1d with an Estimated Date of Delivery: 08/29/18 being seen today for ongoing management of a low-risk pregnancy.  Today she reports no complaints eats like she normally does, appetite OK.  . Vag. Bleeding: None.  Movement: Absent. denies leaking of fluid. Review of Systems:   Pertinent items are noted in HPI Denies abnormal vaginal discharge w/ itching/odor/irritation, headaches, visual changes, shortness of breath, chest pain, abdominal pain, severe nausea/vomiting, or problems with urination or bowel movements unless otherwise stated above.  Pertinent History Reviewed:  Medical & Surgical Hx:   Past Medical History:  Diagnosis Date  . Medical history non-contributory    Past Surgical History:  Procedure Laterality Date  . NO PAST SURGERIES     Family History  Problem Relation Age of Onset  . Hypertension Paternal Grandfather   . Hypertension Paternal Grandmother   . Hypertension Father   . Hypertension Mother     Current Outpatient Medications:  .  Prenatal Vit-Iron Carbonyl-FA (PRENATAL PLUS IRON) 29-1 MG TABS, Take 1 tablet by mouth daily., Disp: 30 tablet, Rfl: 12 Social History: Reviewed -  reports that she has quit smoking. She has never used smokeless tobacco.  Physical Assessment:   Vitals:   03/29/18 1047  BP: 116/68  Pulse: 73  Weight: 185 lb 8 oz (84.1 kg)  Body mass index is 32.86 kg/m.        Physical Examination:   General appearance: Well appearing, and in no distress  Mental status: Alert, oriented to person, place, and time  Skin: Warm & dry  Cardiovascular: Normal heart rate noted  Respiratory: Normal respiratory effort, no distress  Abdomen: Soft, gravid, nontender  Pelvic: Cervical exam deferred          Extremities: Edema: None  Fetal Status:     Movement: Absent   US 18+1 wks,breech,cx 3.7 cm,normal ovaries bilat,posterior pl gr 0,svp of fluid 3.4 cm,fhr 146 bpm,fhr 234 g 55%,anatomy complete,no obvious abnormalities     No results found for this or any previous visit (from the past 24 hour(s)).  Assessment & Plan:  1) Low-risk pregnancy G1P0 at 6629w1d with an Estimated Date of Delivery: 08/29/18   2) ,    Labs/procedures/US today: 2nd IT  Plan:  Continue routine obstetrical care    Follow-up: Return in about 6 weeks (around 05/10/2018) for PN2/LROB.  Orders Placed This Encounter  Procedures  . INTEGRATED 2  . POC Urinalysis Dipstick OB   Jacklyn ShellFrances Cresenzo-Dishmon CNM 03/29/2018 10:59 AM

## 2018-03-29 NOTE — Progress Notes (Signed)
US 18+1 wks,breech,cx 3.7 cm,normal ovaries bilat,posterior pl gr 0,svp of fluid 3.4 cm,fhr 146 bpm,fhr 234 g 55%,anatomy complete,no obvious abnormalities

## 2018-03-29 NOTE — Patient Instructions (Signed)

## 2018-04-05 ENCOUNTER — Telehealth: Payer: Self-pay | Admitting: *Deleted

## 2018-04-05 NOTE — Telephone Encounter (Signed)
Patient states she saw screen positive for spina bifida on mychart. Informed patient we are in the process of having lab recalculated and would give her a call if further testing is needed.  Verbalized understanding.

## 2018-04-07 ENCOUNTER — Encounter: Payer: Self-pay | Admitting: *Deleted

## 2018-04-07 ENCOUNTER — Telehealth: Payer: Self-pay | Admitting: Obstetrics & Gynecology

## 2018-04-07 NOTE — Telephone Encounter (Signed)
Patient called, stated she called the other day for her test results and she is still waiting for a response.  [phone]

## 2018-04-14 LAB — INTEGRATED 1
Crown Rump Length: 68.9 mm
Gest. Age on Collection Date: 13 weeks
MATERNAL AGE AT EDD: 25.2 a
NUCHAL TRANSLUCENCY (NT): 1.5 mm
Number of Fetuses: 1
PAPP-A Value: 1023.6 ng/mL
Weight: 187 [lb_av]

## 2018-04-14 LAB — INTEGRATED 2
AFP MOM: 5.01
Alpha-Fetoprotein: 170.5 ng/mL
Crown Rump Length: 68.9 mm
DIA MoM: 1.08
DIA Value: 164.2 pg/mL
ESTRIOL UNCONJUGATED: 1.36 ng/mL
GEST. AGE ON COLLECTION DATE: 13 wk
Gestational Age: 18 weeks
MATERNAL AGE AT EDD: 25.2 a
NUCHAL TRANSLUCENCY (NT): 1.5 mm
NUMBER OF FETUSES: 1
Nuchal Translucency MoM: 0.96
PAPP-A MoM: 1.21
PAPP-A Value: 1023.6 ng/mL
TEST RESULTS: POSITIVE — AB
WEIGHT: 187 [lb_av]
Weight: 187 [lb_av]
hCG MoM: 1.15
hCG Value: 25.7 IU/mL
uE3 MoM: 1.15

## 2018-04-19 ENCOUNTER — Telehealth: Payer: Self-pay | Admitting: Women's Health

## 2018-04-19 NOTE — Telephone Encounter (Signed)
Pt aware lab didn't calculate nasal bone and will have to rerun. Pt voiced understanding. JSY

## 2018-04-19 NOTE — Telephone Encounter (Signed)
Patient called stating that she would like to know the results of her blood work.  °Please contact pt °

## 2018-05-10 ENCOUNTER — Other Ambulatory Visit: Payer: 59

## 2018-05-10 ENCOUNTER — Encounter: Payer: 59 | Admitting: Women's Health

## 2018-07-25 LAB — OB RESULTS CONSOLE GBS: STREP GROUP B AG: NEGATIVE

## 2018-08-24 ENCOUNTER — Telehealth (HOSPITAL_COMMUNITY): Payer: Self-pay | Admitting: *Deleted

## 2018-08-24 NOTE — Telephone Encounter (Signed)
Preadmission screen  

## 2018-08-30 ENCOUNTER — Other Ambulatory Visit: Payer: Self-pay | Admitting: Obstetrics

## 2018-08-31 NOTE — Progress Notes (Signed)
Duplicate note

## 2018-09-01 ENCOUNTER — Inpatient Hospital Stay (HOSPITAL_COMMUNITY)
Admission: RE | Admit: 2018-09-01 | Discharge: 2018-09-05 | DRG: 787 | Disposition: A | Discharge status: Home / Self Care | Payer: Managed Care, Other (non HMO) | Attending: Obstetrics | Admitting: Obstetrics

## 2018-09-01 ENCOUNTER — Encounter (HOSPITAL_COMMUNITY): Payer: Self-pay

## 2018-09-01 DIAGNOSIS — Z3490 Encounter for supervision of normal pregnancy, unspecified, unspecified trimester: Secondary | ICD-10-CM | POA: Diagnosis present

## 2018-09-01 DIAGNOSIS — O324XX0 Maternal care for high head at term, not applicable or unspecified: Secondary | ICD-10-CM | POA: Diagnosis present

## 2018-09-01 DIAGNOSIS — Z3402 Encounter for supervision of normal first pregnancy, second trimester: Secondary | ICD-10-CM

## 2018-09-01 DIAGNOSIS — O9081 Anemia of the puerperium: Secondary | ICD-10-CM | POA: Diagnosis not present

## 2018-09-01 DIAGNOSIS — O480 Post-term pregnancy: Principal | ICD-10-CM | POA: Diagnosis present

## 2018-09-01 DIAGNOSIS — O3663X0 Maternal care for excessive fetal growth, third trimester, not applicable or unspecified: Secondary | ICD-10-CM | POA: Diagnosis present

## 2018-09-01 DIAGNOSIS — Z87891 Personal history of nicotine dependence: Secondary | ICD-10-CM

## 2018-09-01 DIAGNOSIS — D62 Acute posthemorrhagic anemia: Secondary | ICD-10-CM | POA: Diagnosis not present

## 2018-09-01 DIAGNOSIS — O9902 Anemia complicating childbirth: Secondary | ICD-10-CM | POA: Diagnosis not present

## 2018-09-01 DIAGNOSIS — O99892 Other specified diseases and conditions complicating childbirth: Secondary | ICD-10-CM | POA: Diagnosis not present

## 2018-09-01 DIAGNOSIS — Z3A40 40 weeks gestation of pregnancy: Secondary | ICD-10-CM | POA: Diagnosis not present

## 2018-09-01 LAB — ABO/RH: ABO/RH(D): B POS

## 2018-09-01 LAB — CBC
HCT: 36.6 % (ref 36.0–46.0)
Hemoglobin: 12.1 g/dL (ref 12.0–15.0)
MCH: 29.2 pg (ref 26.0–34.0)
MCHC: 33.1 g/dL (ref 30.0–36.0)
MCV: 88.4 fL (ref 80.0–100.0)
Platelets: 218 10*3/uL (ref 150–400)
RBC: 4.14 MIL/uL (ref 3.87–5.11)
RDW: 13.8 % (ref 11.5–15.5)
WBC: 9 10*3/uL (ref 4.0–10.5)
nRBC: 0 % (ref 0.0–0.2)

## 2018-09-01 LAB — TYPE AND SCREEN
ABO/RH(D): B POS
Antibody Screen: NEGATIVE

## 2018-09-01 MED ORDER — ACETAMINOPHEN 325 MG PO TABS: 650.0000 mg | ORAL_TABLET | ORAL | Status: DC | PRN

## 2018-09-01 MED ORDER — SOD CITRATE-CITRIC ACID 500-334 MG/5ML PO SOLN
30.0000 mL | ORAL | Status: DC | PRN
  Administered 2018-09-02: 30 mL via ORAL
  Filled 2018-09-01: qty 15

## 2018-09-01 MED ORDER — LACTATED RINGERS IV SOLN
INTRAVENOUS | Status: DC
  Administered 2018-09-02 (×4): via INTRAVENOUS

## 2018-09-01 MED ORDER — TERBUTALINE SULFATE 1 MG/ML IJ SOLN: 0.2500 mg | Freq: Once | INTRAMUSCULAR | Status: DC | PRN

## 2018-09-01 MED ORDER — FENTANYL 2.5 MCG/ML BUPIVACAINE 1/10 % EPIDURAL INFUSION (WH - ANES)
14.0000 mL/h | INTRAMUSCULAR | Status: DC | PRN
  Administered 2018-09-02 (×2): 14 mL/h via EPIDURAL
  Filled 2018-09-01 (×2): qty 100

## 2018-09-01 MED ORDER — EPHEDRINE 5 MG/ML INJ: 10.0000 mg | INTRAVENOUS | Status: DC | PRN

## 2018-09-01 MED ORDER — ZOLPIDEM TARTRATE 5 MG PO TABS
5.0000 mg | ORAL_TABLET | Freq: Once | ORAL | Status: AC
  Administered 2018-09-01: 5 mg via ORAL
  Filled 2018-09-01: qty 1

## 2018-09-01 MED ORDER — MISOPROSTOL 25 MCG QUARTER TABLET
25.0000 ug | ORAL_TABLET | ORAL | Status: DC | PRN
  Administered 2018-09-01 (×2): 25 ug via VAGINAL
  Filled 2018-09-01 (×3): qty 1

## 2018-09-01 MED ORDER — LACTATED RINGERS IV SOLN
500.0000 mL | Freq: Once | INTRAVENOUS | Status: AC
  Administered 2018-09-02: 500 mL via INTRAVENOUS

## 2018-09-01 MED ORDER — PHENYLEPHRINE 40 MCG/ML (10ML) SYRINGE FOR IV PUSH (FOR BLOOD PRESSURE SUPPORT): 80.0000 ug | PREFILLED_SYRINGE | INTRAVENOUS | Status: DC | PRN

## 2018-09-01 MED ORDER — LIDOCAINE HCL (PF) 1 % IJ SOLN: 30.0000 mL | INTRAMUSCULAR | Status: DC | PRN

## 2018-09-01 MED ORDER — ONDANSETRON HCL 4 MG/2ML IJ SOLN
4.0000 mg | Freq: Four times a day (QID) | INTRAMUSCULAR | Status: DC | PRN
  Administered 2018-09-02 (×2): 4 mg via INTRAVENOUS
  Filled 2018-09-01: qty 2

## 2018-09-01 MED ORDER — DIPHENHYDRAMINE HCL 50 MG/ML IJ SOLN: 12.5000 mg | INTRAMUSCULAR | Status: DC | PRN

## 2018-09-01 MED ORDER — LACTATED RINGERS IV SOLN: 500.0000 mL | INTRAVENOUS | Status: DC | PRN

## 2018-09-01 MED ORDER — OXYTOCIN 40 UNITS IN NORMAL SALINE INFUSION - SIMPLE MED: 2.5000 [IU]/h | INTRAVENOUS | Status: DC

## 2018-09-01 MED ORDER — OXYTOCIN BOLUS FROM INFUSION: 500.0000 mL | Freq: Once | INTRAVENOUS | Status: DC

## 2018-09-01 MED ORDER — PHENYLEPHRINE 40 MCG/ML (10ML) SYRINGE FOR IV PUSH (FOR BLOOD PRESSURE SUPPORT)
80.0000 ug | PREFILLED_SYRINGE | INTRAVENOUS | Status: DC | PRN
  Filled 2018-09-01: qty 10

## 2018-09-01 NOTE — Progress Notes (Signed)
S: Doing well, no complaints, pain well controlled at this time, on cytotec  O: BP 133/78   Pulse 84   Temp 98.2 F (36.8 C) (Oral)   Resp 17   Ht 5\' 4"  (1.626 m)   Wt 91.6 kg   LMP  (LMP Unknown)   BMI 34.67 kg/m    FHT:  FHR: 130s bpm, variability: moderate,  accelerations:  Present,  decelerations:  Absent UC:   Irregular irritibiilty SVE:   Dilation: 1 Effacement (%): 50 Station: -2 Exam by:: Casey Burkitt, RN   A / P:  27 y.o.  OB History  Gravida Para Term Preterm AB Living  1 0 0 0 0 0  SAB TAB Ectopic Multiple Live Births  0 0 0 0 0   at [redacted]w[redacted]d IOL at term, elective, 2nd cytotec now for ripening, will then start pitocin  Fetal Wellbeing:  Category I Pain Control:  Labor support without medications  Anticipated MOD:  NSVD  Lendon Colonel 09/01/2018, 11:08 PM

## 2018-09-01 NOTE — Plan of Care (Signed)
  Problem: Health Behavior/Discharge Planning: Goal: Ability to manage health-related needs will improve Outcome: Progressing   Problem: Clinical Measurements: Goal: Ability to maintain clinical measurements within normal limits will improve Outcome: Progressing Goal: Will remain free from infection Outcome: Progressing Goal: Diagnostic test results will improve Outcome: Progressing Goal: Respiratory complications will improve Outcome: Progressing Goal: Cardiovascular complication will be avoided Outcome: Progressing   Problem: Activity: Goal: Risk for activity intolerance will decrease Outcome: Progressing   Problem: Nutrition: Goal: Adequate nutrition will be maintained Outcome: Progressing   Problem: Elimination: Goal: Will not experience complications related to bowel motility Outcome: Progressing Goal: Will not experience complications related to urinary retention Outcome: Progressing   Problem: Pain Managment: Goal: General experience of comfort will improve Outcome: Progressing   Problem: Skin Integrity: Goal: Risk for impaired skin integrity will decrease Outcome: Progressing   

## 2018-09-01 NOTE — Anesthesia Pain Management Evaluation Note (Signed)
  CRNA Pain Management Visit Note  Patient: Norma Ramirez, 28 y.o., female  "Hello I am a member of the anesthesia team at [facility]. We have an anesthesia team available at all times to provide care throughout the hospital, including epidural management and anesthesia for C-section. I don't know your plan for the delivery whether it a natural birth, water birth, IV sedation, nitrous supplementation, doula or epidural, but we want to meet your pain goals."   1.Was your pain managed to your expectations on prior hospitalizations?   No prior hospitalizations  2.What is your expectation for pain management during this hospitalization?     Epidural and IV pain meds  3.How can we help you reach that goal? Be available  Record the patient's initial score and the patient's pain goal.   Pain: 0  Pain Goal: 5 [facility] wants you to be able to say your pain was always managed very well.  [facility] 09/01/2018

## 2018-09-01 NOTE — H&P (Signed)
Norma Ramirez is a 28 y.o. G1P0 at [redacted]w[redacted]d presenting for post-EDC IOL. Pt notes rare contractions . Good fetal movement, No vaginal bleeding, not leaking fluid.  PNCare at [facility] since 22 wks - Normal early PN care at [facility] - Increased AFP for ONTD, nl u/s, test not repeated - DS 134 - GBS neg    Prenatal Transfer Tool  Maternal Diabetes: No Genetic Screening: Normal Maternal Ultrasounds/Referrals: Normal Fetal Ultrasounds or other Referrals:  None Maternal Substance Abuse:  No Significant Maternal Medications:  None Significant Maternal Lab Results: None             OB History    Gravida  1   Para      Term      Preterm      AB      Living        SAB      TAB      Ectopic      Multiple      Live Births                 Past Medical History:  Diagnosis Date  . Medical history non-contributory         Past Surgical History:  Procedure Laterality Date  . NO PAST SURGERIES     Family History: family history includes Hypertension in her father, mother, paternal grandfather, and paternal grandmother. Social History:  reports that she has quit smoking. She has never used smokeless tobacco. She reports previous alcohol use. She reports that she does not use drugs.  Review of Systems - Negative except discomfort of pregnancy   There were no vitals taken for this visit.  Physical Exam:  Gen: well appearing, no distress   Abd: gravid, NT, no RUQ pain LE: trace edema, equal bilaterally, non-tender Toco: irritibility FH: baseline 150s, accelerations present, no deceleratons, 10 beat variability  Prenatal labs: ABO, Rh: B/Positive/-- (06/03 1154) Antibody: Negative (06/03 1154) Rubella: 3.08 (06/03 1154) RPR: Non Reactive (06/03 1154)  HBsAg: Negative (06/03 1154)  HIV: Non Reactive (06/03 1154)  GBS: Negative (12/03 0000)  1 hr Glucola 134          Genetic screening normal NT Anatomy US  normal   Assessment/Plan: 28 y.o. G1P0 at [redacted]w[redacted]d - post-EDC, plan IOL. Start with cytotec then re-eval cvx, probable cervical foley - GBS neg - palpates LGA, watch labor curve       Lendon Colonel 09/01/2018 5:34 PM

## 2018-09-02 ENCOUNTER — Inpatient Hospital Stay (HOSPITAL_COMMUNITY): Payer: Managed Care, Other (non HMO) | Admitting: Anesthesiology

## 2018-09-02 ENCOUNTER — Encounter (HOSPITAL_COMMUNITY): Payer: Self-pay

## 2018-09-02 ENCOUNTER — Encounter (HOSPITAL_COMMUNITY)
Admission: RE | Disposition: A | Discharge status: Home / Self Care | Payer: Self-pay | Source: Home / Self Care | Attending: Obstetrics

## 2018-09-02 LAB — DIC (DISSEMINATED INTRAVASCULAR COAGULATION) PANEL: PLATELETS: 189 10*3/uL (ref 150–400)

## 2018-09-02 LAB — DIC (DISSEMINATED INTRAVASCULAR COAGULATION)PANEL
D-Dimer, Quant: 2.48 ug/mL-FEU — ABNORMAL HIGH (ref 0.00–0.50)
Fibrinogen: 537 mg/dL — ABNORMAL HIGH (ref 210–475)
INR: 1.04
Prothrombin Time: 13.5 seconds (ref 11.4–15.2)
Smear Review: NONE SEEN
aPTT: 28 seconds (ref 24–36)

## 2018-09-02 LAB — CBC
HCT: 36.5 % (ref 36.0–46.0)
Hemoglobin: 12.2 g/dL (ref 12.0–15.0)
MCH: 29.5 pg (ref 26.0–34.0)
MCHC: 33.4 g/dL (ref 30.0–36.0)
MCV: 88.2 fL (ref 80.0–100.0)
Platelets: 189 10*3/uL (ref 150–400)
RBC: 4.14 MIL/uL (ref 3.87–5.11)
RDW: 13.9 % (ref 11.5–15.5)
WBC: 12.7 10*3/uL — ABNORMAL HIGH (ref 4.0–10.5)
nRBC: 0 % (ref 0.0–0.2)

## 2018-09-02 LAB — RPR: RPR Ser Ql: NONREACTIVE

## 2018-09-02 SURGERY — Surgical Case
Anesthesia: Epidural

## 2018-09-02 MED ORDER — TERBUTALINE SULFATE 1 MG/ML IJ SOLN: 0.2500 mg | Freq: Once | INTRAMUSCULAR | Status: DC | PRN

## 2018-09-02 MED ORDER — PHENYLEPHRINE 40 MCG/ML (10ML) SYRINGE FOR IV PUSH (FOR BLOOD PRESSURE SUPPORT)
PREFILLED_SYRINGE | INTRAVENOUS | Status: AC
  Filled 2018-09-02: qty 10

## 2018-09-02 MED ORDER — SCOPOLAMINE 1 MG/3DAYS TD PT72
MEDICATED_PATCH | TRANSDERMAL | Status: AC
  Filled 2018-09-02: qty 1

## 2018-09-02 MED ORDER — DEXAMETHASONE SODIUM PHOSPHATE 10 MG/ML IJ SOLN
INTRAMUSCULAR | Status: AC
  Filled 2018-09-02: qty 1

## 2018-09-02 MED ORDER — CEFAZOLIN SODIUM-DEXTROSE 2-4 GM/100ML-% IV SOLN
INTRAVENOUS | Status: AC
  Filled 2018-09-02: qty 100

## 2018-09-02 MED ORDER — CEFAZOLIN SODIUM-DEXTROSE 2-3 GM-%(50ML) IV SOLR
INTRAVENOUS | Status: DC | PRN
  Administered 2018-09-02: 2 g via INTRAVENOUS

## 2018-09-02 MED ORDER — LACTATED RINGERS IV SOLN
INTRAVENOUS | Status: DC | PRN
  Administered 2018-09-02: 23:00:00 via INTRAVENOUS

## 2018-09-02 MED ORDER — SODIUM CHLORIDE 0.9 % IV SOLN
INTRAVENOUS | Status: DC | PRN
  Administered 2018-09-02: 60 ug/min via INTRAVENOUS

## 2018-09-02 MED ORDER — SCOPOLAMINE 1 MG/3DAYS TD PT72
MEDICATED_PATCH | TRANSDERMAL | Status: DC | PRN
  Administered 2018-09-02: 1 via TRANSDERMAL

## 2018-09-02 MED ORDER — PHENYLEPHRINE 8 MG IN D5W 100 ML (0.08MG/ML) PREMIX OPTIME
INJECTION | INTRAVENOUS | Status: AC
  Filled 2018-09-02: qty 100

## 2018-09-02 MED ORDER — MEPERIDINE HCL 25 MG/ML IJ SOLN
INTRAMUSCULAR | Status: DC | PRN
  Administered 2018-09-02 (×2): 12.5 mg via INTRAVENOUS

## 2018-09-02 MED ORDER — SODIUM CHLORIDE 0.9 % IR SOLN
Status: DC | PRN
  Administered 2018-09-02: 1000 mL

## 2018-09-02 MED ORDER — LIDOCAINE-EPINEPHRINE (PF) 2 %-1:200000 IJ SOLN
INTRAMUSCULAR | Status: DC | PRN
  Administered 2018-09-02 (×3): 5 mL via EPIDURAL

## 2018-09-02 MED ORDER — PHENYLEPHRINE HCL 10 MG/ML IJ SOLN
INTRAMUSCULAR | Status: DC | PRN
  Administered 2018-09-02: 80 ug via INTRAVENOUS
  Administered 2018-09-02: 40 ug via INTRAVENOUS
  Administered 2018-09-02 (×2): 80 ug via INTRAVENOUS
  Administered 2018-09-02: 40 ug via INTRAVENOUS
  Administered 2018-09-02: 80 ug via INTRAVENOUS

## 2018-09-02 MED ORDER — MORPHINE SULFATE (PF) 0.5 MG/ML IJ SOLN
INTRAMUSCULAR | Status: DC | PRN
  Administered 2018-09-02: 3 mg via EPIDURAL

## 2018-09-02 MED ORDER — MORPHINE SULFATE (PF) 0.5 MG/ML IJ SOLN
INTRAMUSCULAR | Status: AC
  Filled 2018-09-02: qty 10

## 2018-09-02 MED ORDER — OXYTOCIN 40 UNITS IN NORMAL SALINE INFUSION - SIMPLE MED: 1.0000 m[IU]/min | INTRAVENOUS | Status: DC

## 2018-09-02 MED ORDER — METOCLOPRAMIDE HCL 5 MG/ML IJ SOLN
INTRAMUSCULAR | Status: DC | PRN
  Administered 2018-09-02: 10 mg via INTRAVENOUS

## 2018-09-02 MED ORDER — OXYTOCIN 10 UNIT/ML IJ SOLN
INTRAVENOUS | Status: DC | PRN
  Administered 2018-09-02: 40 [IU] via INTRAVENOUS

## 2018-09-02 MED ORDER — ONDANSETRON HCL 4 MG/2ML IJ SOLN
INTRAMUSCULAR | Status: AC
  Filled 2018-09-02: qty 2

## 2018-09-02 MED ORDER — OXYTOCIN 40 UNITS IN NORMAL SALINE INFUSION - SIMPLE MED
1.0000 m[IU]/min | INTRAVENOUS | Status: DC
  Administered 2018-09-02: 2 m[IU]/min via INTRAVENOUS
  Filled 2018-09-02: qty 1000

## 2018-09-02 MED ORDER — DEXAMETHASONE SODIUM PHOSPHATE 10 MG/ML IJ SOLN
INTRAMUSCULAR | Status: DC | PRN
  Administered 2018-09-02: 10 mg via INTRAVENOUS

## 2018-09-02 MED ORDER — MEPERIDINE HCL 25 MG/ML IJ SOLN
INTRAMUSCULAR | Status: AC
  Filled 2018-09-02: qty 1

## 2018-09-02 MED ORDER — LIDOCAINE HCL (PF) 1 % IJ SOLN
INTRAMUSCULAR | Status: DC | PRN
  Administered 2018-09-02: 7 mL via EPIDURAL

## 2018-09-02 MED ORDER — OXYTOCIN 10 UNIT/ML IJ SOLN
INTRAMUSCULAR | Status: AC
  Filled 2018-09-02: qty 4

## 2018-09-02 MED ORDER — DIPHENHYDRAMINE HCL 50 MG/ML IJ SOLN: 12.5000 mg | INTRAMUSCULAR | Status: DC | PRN

## 2018-09-02 SURGICAL SUPPLY — 36 items
BENZOIN TINCTURE PRP APPL 2/3 (GAUZE/BANDAGES/DRESSINGS) ×2 IMPLANT
CHLORAPREP W/TINT 26ML (MISCELLANEOUS) ×2 IMPLANT
CLAMP CORD UMBIL (MISCELLANEOUS) IMPLANT
CLOTH BEACON ORANGE TIMEOUT ST (SAFETY) ×2 IMPLANT
DRSG OPSITE POSTOP 4X10 (GAUZE/BANDAGES/DRESSINGS) ×2 IMPLANT
ELECT REM PT RETURN 9FT ADLT (ELECTROSURGICAL) ×2
ELECTRODE REM PT RTRN 9FT ADLT (ELECTROSURGICAL) ×1 IMPLANT
EXTENDER TRAXI PANNICULUS (MISCELLANEOUS) ×1 IMPLANT
EXTRACTOR VACUUM M CUP 4 TUBE (SUCTIONS) IMPLANT
GLOVE BIO SURGEON STRL SZ 6.5 (GLOVE) ×2 IMPLANT
GLOVE BIOGEL PI IND STRL 7.0 (GLOVE) ×2 IMPLANT
GLOVE BIOGEL PI INDICATOR 7.0 (GLOVE) ×2
GOWN STRL REUS W/TWL LRG LVL3 (GOWN DISPOSABLE) ×4 IMPLANT
KIT ABG SYR 3ML LUER SLIP (SYRINGE) IMPLANT
NEEDLE HYPO 22GX1.5 SAFETY (NEEDLE) IMPLANT
NEEDLE HYPO 25X5/8 SAFETYGLIDE (NEEDLE) IMPLANT
NS IRRIG 1000ML POUR BTL (IV SOLUTION) ×2 IMPLANT
PACK C SECTION WH (CUSTOM PROCEDURE TRAY) ×2 IMPLANT
PAD ABD 8X7 1/2 STERILE (GAUZE/BANDAGES/DRESSINGS) ×2 IMPLANT
PAD OB MATERNITY 4.3X12.25 (PERSONAL CARE ITEMS) ×2 IMPLANT
PENCIL SMOKE EVAC W/HOLSTER (ELECTROSURGICAL) ×2 IMPLANT
SPONGE LAP 18X18 RF (DISPOSABLE) ×6 IMPLANT
STRIP CLOSURE SKIN 1/2X4 (GAUZE/BANDAGES/DRESSINGS) IMPLANT
SUT MON AB 4-0 PS1 27 (SUTURE) ×2 IMPLANT
SUT PLAIN 0 NONE (SUTURE) IMPLANT
SUT PLAIN 2 0 XLH (SUTURE) IMPLANT
SUT VIC AB 0 CT1 36 (SUTURE) ×4 IMPLANT
SUT VIC AB 0 CTX 36 (SUTURE) ×2
SUT VIC AB 0 CTX36XBRD ANBCTRL (SUTURE) ×2 IMPLANT
SUT VIC AB 2-0 CT1 27 (SUTURE) ×1
SUT VIC AB 2-0 CT1 TAPERPNT 27 (SUTURE) ×1 IMPLANT
SUT VIC AB 4-0 KS 27 (SUTURE) ×2 IMPLANT
SYR CONTROL 10ML LL (SYRINGE) IMPLANT
TOWEL OR 17X24 6PK STRL BLUE (TOWEL DISPOSABLE) ×2 IMPLANT
TRAXI PANNICULUS EXTENDER (MISCELLANEOUS) ×1
TRAY FOLEY W/BAG SLVR 14FR LF (SET/KITS/TRAYS/PACK) IMPLANT

## 2018-09-02 NOTE — Progress Notes (Signed)
Called to see patient due to nursing concerns over vaginal bleeding lack of fetal descent after 1 hour of pushing and concerns over fetal status.  S: Doing well, no complaints, pain well controlled with epidural  Nursing reports pushing for the past hour from +2 station.  On subsequent phone call nursing reports station +1.  Nursing reports no change in fetal descent after 1 hour of pushing.  Nurse also reports bright red bleeding pooling in the vagina.  Nurse reports continued decelerations.  O: BP 134/78   Pulse 94   Temp 98.8 F (37.1 C)   Resp 18   Ht 5\' 4"  (1.626 m)   Wt 91.6 kg   LMP  (LMP Unknown)   SpO2 97%   BMI 34.67 kg/m    FHT:  FHR: 120s bpm, variability: moderate,  accelerations:  Present,  decelerations:  Present Early and variable decelerations present good variability within the decelerations.  No late appearing decelerations UC:   regular, every 3 minutes, inadequate [place] units since Pitocin decreased from 6 milliunits/min to 8 milliunits/min SVE:   Dilation: 10 Effacement (%): 100 Station: Plus 2 Exam by:: B Holliday RN  Repeat exam by me at 8:30 PM: 9 cm, 100% effaced, vertex 0 station  A / P:  27 y.o.  OB History  Gravida Para Term Preterm AB Living  1 0 0 0 0 0  SAB TAB Ectopic Multiple Live Births  0 0 0 0 0   at [redacted]w[redacted]d Induction of labor at term, elective.  Once in active labor patient has made excellent progress from 5 to 9 cm.  Station still remains high but has come down from -3-0 station.  Some asynclitic systems still present though this has improved.  Despite reports of pushing over 1 hour with no descent patient has only pushed with 6 contractions over this hour and in a primiparous patient descent is not expected in this timeframe.  Furthermore cervix on reassessment is 9 cm.  Will stop pushing and increase Pitocin to adequate MVU"s.  Exaggerated symptoms to help with fetal descent..  Bleeding is slightly more than expected.  This could  possibly be from pushing against a 9 cm cervix though the possibility of abruption is discussed with patient.  We will continue to watch bleeding though on my exam bleeding still small amounts and tolerable.  Will repeat CBC and check coagulation studies.  Discussed with patient and family should abruption be present this could pose a significant risk to the baby and should this worsen an emergency C-section would be needed.  They agree with the continued trial of labor.  Fetal Wellbeing:  Category I Pain Control:  Epidural  Anticipated MOD:  Unclear mode of delivery at this time.  Fetal station still at 0 and suspect LGA.  Patient does not yet have a diagnosis of arrest of dilation or arrest of descent.  We will continue trial of labor  Lendon Colonel 09/02/2018, 8:58 PM

## 2018-09-02 NOTE — Progress Notes (Signed)
Called Dr Ernestina Penna to make her aware that pt is completely dilated.  Also discussed the periods of marked variability as well as the contraction pattern (lessening intensity, lower MVUs, and inadequate rest between contractions).  Per Dr Ernestina Penna, decrease pitocin by half (42mu/min) and begin pushing.

## 2018-09-02 NOTE — Anesthesia Preprocedure Evaluation (Signed)
Anesthesia Evaluation  Patient identified by MRN, date of birth, ID band Patient awake    Reviewed: Allergy & Precautions, H&P , NPO status , Patient's Chart, lab work & pertinent test results, reviewed documented beta blocker date and time   Airway Mallampati: II  TM Distance: >3 FB Neck ROM: full    Dental no notable dental hx.    Pulmonary neg pulmonary ROS, former smoker,    Pulmonary exam normal breath sounds clear to auscultation       Cardiovascular negative cardio ROS Normal cardiovascular exam Rhythm:regular Rate:Normal     Neuro/Psych negative neurological ROS  negative psych ROS   GI/Hepatic negative GI ROS, Neg liver ROS,   Endo/Other  negative endocrine ROS  Renal/GU negative Renal ROS  negative genitourinary   Musculoskeletal   Abdominal   Peds  Hematology negative hematology ROS (+)   Anesthesia Other Findings   Reproductive/Obstetrics (+) Pregnancy                             Anesthesia Physical Anesthesia Plan  ASA: II  Anesthesia Plan: Epidural   Post-op Pain Management:    Induction:   PONV Risk Score and Plan: 2  Airway Management Planned:   Additional Equipment:   Intra-op Plan:   Post-operative Plan:   Informed Consent: I have reviewed the patients History and Physical, chart, labs and discussed the procedure including the risks, benefits and alternatives for the proposed anesthesia with the patient or authorized representative who has indicated his/her understanding and acceptance.   Dental Advisory Given  Plan Discussed with: CRNA, Anesthesiologist and Surgeon  Anesthesia Plan Comments: (Labs checked- platelets confirmed with RN in room. Fetal heart tracing, per RN, reported to be stable enough for sitting procedure. Discussed epidural, and patient consents to the procedure:  included risk of possible headache,backache, failed block, allergic  reaction, and nerve injury. This patient was asked if she had any questions or concerns before the procedure started.)        Anesthesia Quick Evaluation

## 2018-09-02 NOTE — Progress Notes (Signed)
S: Doing well, no complaints, pain well controlled with epidural Resting off and on through the afternoon.  O: BP 123/67   Pulse 83   Temp 98.3 F (36.8 C) (Oral)   Resp 18   Ht 5\' 4"  (1.626 m)   Wt 91.6 kg   LMP  (LMP Unknown)   BMI 34.67 kg/m    FHT:  FHR: 125's bpm, variability: moderate,  accelerations:  Present,  decelerations:  Present Early D cells with most contractions UC:   regular, every 2 minutes SVE:   Dilation: 5 Effacement (%): 90 Station: -2 Exam by:: Norma Ramirez  Asynclitic presentation.  A / P:  27 y.o.  OB History  Gravida Para Term Preterm AB Living  1 0 0 0 0 0  SAB TAB Ectopic Multiple Live Births  0 0 0 0 0   at [redacted]w[redacted]d Induction of labor at term.  Adequate progress after Cytotec and cervical Foley.  Now on Pitocin.  Spontaneous rupture of membranes.  Asynclitic position and high station.  Will try exaggerated symptoms.  IUPC placed.  Fetal Wellbeing:  Category I Pain Control:  Epidural  Anticipated MOD:  Working towards vaginal delivery.  High station and asynclitic presentation concerning for arrest of descent  Lendon Colonel 09/02/2018, 4:38 PM

## 2018-09-02 NOTE — Progress Notes (Signed)
S: Doing well, no complaints, pain increasing after foley bulb placed, pt asking for epidural  O: BP 127/66   Pulse 90   Temp 97.7 F (36.5 C) (Oral)   Resp 18   Ht 5\' 4"  (1.626 m)   Wt 91.6 kg   LMP  (LMP Unknown)   BMI 34.67 kg/m    FHT:  FHR: 140s bpm, variability: moderate,  accelerations:  Present,  decelerations:  Absent UC:   regular, every 3 minutes SVE:   Dilation: 1.5 Effacement (%): 50 Station: -2 Exam by:: Peter Keyworth   A / P:  27 y.o.  OB History  Gravida Para Term Preterm AB Living  1 0 0 0 0 0  SAB TAB Ectopic Multiple Live Births  0 0 0 0 0   at [redacted]w[redacted]d Induction of labor at term, elective  Fetal Wellbeing:  Category I Pain Control:  Epidural  Anticipated MOD:  Working towards vaginal delivery.  Continue Pitocin  Lendon Colonel 09/02/2018, 10:13 AM

## 2018-09-02 NOTE — Anesthesia Procedure Notes (Signed)
Epidural Patient location during procedure: OB Start time: 09/02/2018 9:27 AM End time: 09/02/2018 9:30 AM  Staffing Anesthesiologist: Bethena Midget, MD  Preanesthetic Checklist Completed: patient identified, site marked, surgical consent, pre-op evaluation, timeout performed, IV checked, risks and benefits discussed and monitors and equipment checked  Epidural Patient position: sitting Prep: site prepped and draped and DuraPrep Patient monitoring: continuous pulse ox and blood pressure Approach: midline Location: L3-L4 Injection technique: LOR air  Needle:  Needle type: Tuohy  Needle gauge: 17 G Needle length: 9 cm and 9 Needle insertion depth: 6 cm Catheter type: closed end flexible Catheter size: 19 Gauge Catheter at skin depth: 11 cm Test dose: negative  Assessment Events: blood not aspirated, injection not painful, no injection resistance, negative IV test and no paresthesia

## 2018-09-02 NOTE — Progress Notes (Signed)
Call from nurse: early decels and vaginal bleeding  More than expected vaginal bleeding, continued 'trickle' per nurse.  cvx 7-8cm, asynclitic Toco: q 2-3, adequate MVU FH: 120s, + accels, early decels with all contractions  A/P: likely rapid cervical change contributing to bleeding/ decels. Close monitoring. If bleeding continues, repeat CBC/ add coags. Expect continued cervical change given adequate contraction strength. Watch for arrest of descent with high station and asynclitic position, plan exaggerated Simms.   Norma Ramirez 09/02/2018 6:20 PM

## 2018-09-02 NOTE — Transfer of Care (Signed)
Immediate Anesthesia Transfer of Care Note  Patient: Norma Ramirez  Procedure(s) Performed: CESAREAN SECTION (N/A )  Patient Location: PACU  Anesthesia Type:Epidural  Level of Consciousness: awake, alert  and patient cooperative  Airway & Oxygen Therapy: Patient Spontanous Breathing  Post-op Assessment: Report given to RN and Post -op Vital signs reviewed and stable  Post vital signs: stable  Last Vitals:  Vitals Value Taken Time  BP 91/66 09/02/2018 11:56 PM  Temp    Pulse 110 09/02/2018 11:57 PM  Resp 18 09/02/2018 11:57 PM  SpO2 96 % 09/02/2018 11:57 PM  Vitals shown include unvalidated device data.  Last Pain:  Vitals:   09/02/18 2118  TempSrc:   PainSc: 0-No pain         Complications: No apparent anesthesia complications

## 2018-09-03 ENCOUNTER — Encounter (HOSPITAL_COMMUNITY): Payer: Self-pay

## 2018-09-03 DIAGNOSIS — O9902 Anemia complicating childbirth: Secondary | ICD-10-CM | POA: Diagnosis not present

## 2018-09-03 DIAGNOSIS — O99892 Other specified diseases and conditions complicating childbirth: Secondary | ICD-10-CM | POA: Diagnosis not present

## 2018-09-03 LAB — CBC
HCT: 29.2 % — ABNORMAL LOW (ref 36.0–46.0)
Hemoglobin: 9.8 g/dL — ABNORMAL LOW (ref 12.0–15.0)
MCH: 29.3 pg (ref 26.0–34.0)
MCHC: 33.6 g/dL (ref 30.0–36.0)
MCV: 87.2 fL (ref 80.0–100.0)
Platelets: 185 10*3/uL (ref 150–400)
RBC: 3.35 MIL/uL — ABNORMAL LOW (ref 3.87–5.11)
RDW: 13.8 % (ref 11.5–15.5)
WBC: 17.1 10*3/uL — ABNORMAL HIGH (ref 4.0–10.5)

## 2018-09-03 MED ORDER — MEPERIDINE HCL 25 MG/ML IJ SOLN: 6.2500 mg | INTRAMUSCULAR | Status: DC | PRN

## 2018-09-03 MED ORDER — OXYTOCIN 40 UNITS IN NORMAL SALINE INFUSION - SIMPLE MED: 2.5000 [IU]/h | INTRAVENOUS | Status: AC

## 2018-09-03 MED ORDER — IBUPROFEN 800 MG PO TABS
800.0000 mg | ORAL_TABLET | Freq: Three times a day (TID) | ORAL | Status: DC
  Administered 2018-09-03 – 2018-09-05 (×8): 800 mg via ORAL
  Filled 2018-09-03 (×8): qty 1

## 2018-09-03 MED ORDER — SIMETHICONE 80 MG PO CHEW
80.0000 mg | CHEWABLE_TABLET | ORAL | Status: DC
  Administered 2018-09-04: 80 mg via ORAL
  Filled 2018-09-03: qty 1

## 2018-09-03 MED ORDER — ONDANSETRON HCL 4 MG/2ML IJ SOLN: 4.0000 mg | Freq: Three times a day (TID) | INTRAMUSCULAR | Status: DC | PRN

## 2018-09-03 MED ORDER — WITCH HAZEL-GLYCERIN EX PADS: 1.0000 "application " | MEDICATED_PAD | CUTANEOUS | Status: DC | PRN

## 2018-09-03 MED ORDER — DIBUCAINE 1 % RE OINT: 1.0000 "application " | TOPICAL_OINTMENT | RECTAL | Status: DC | PRN

## 2018-09-03 MED ORDER — OXYCODONE HCL 5 MG/5ML PO SOLN: 5.0000 mg | Freq: Once | ORAL | Status: DC | PRN

## 2018-09-03 MED ORDER — FENTANYL CITRATE (PF) 100 MCG/2ML IJ SOLN: 25.0000 ug | INTRAMUSCULAR | Status: DC | PRN

## 2018-09-03 MED ORDER — POLYSACCHARIDE IRON COMPLEX 150 MG PO CAPS
150.0000 mg | ORAL_CAPSULE | Freq: Every day | ORAL | Status: DC
  Administered 2018-09-03 – 2018-09-05 (×3): 150 mg via ORAL
  Filled 2018-09-03 (×3): qty 1

## 2018-09-03 MED ORDER — MENTHOL 3 MG MT LOZG: 1.0000 | LOZENGE | OROMUCOSAL | Status: DC | PRN

## 2018-09-03 MED ORDER — ACETAMINOPHEN 160 MG/5ML PO SOLN: 325.0000 mg | ORAL | Status: DC | PRN

## 2018-09-03 MED ORDER — DIPHENHYDRAMINE HCL 25 MG PO CAPS: 25.0000 mg | ORAL_CAPSULE | Freq: Four times a day (QID) | ORAL | Status: DC | PRN

## 2018-09-03 MED ORDER — LACTATED RINGERS IV SOLN: INTRAVENOUS | Status: DC

## 2018-09-03 MED ORDER — ZOLPIDEM TARTRATE 5 MG PO TABS: 5.0000 mg | ORAL_TABLET | Freq: Every evening | ORAL | Status: DC | PRN

## 2018-09-03 MED ORDER — SODIUM CHLORIDE 0.9% FLUSH: 3.0000 mL | INTRAVENOUS | Status: DC | PRN

## 2018-09-03 MED ORDER — SIMETHICONE 80 MG PO CHEW
80.0000 mg | CHEWABLE_TABLET | Freq: Three times a day (TID) | ORAL | Status: DC
  Administered 2018-09-03 – 2018-09-05 (×7): 80 mg via ORAL
  Filled 2018-09-03 (×6): qty 1

## 2018-09-03 MED ORDER — SENNOSIDES-DOCUSATE SODIUM 8.6-50 MG PO TABS
2.0000 | ORAL_TABLET | ORAL | Status: DC
  Administered 2018-09-04: 2 via ORAL
  Filled 2018-09-03: qty 2

## 2018-09-03 MED ORDER — NALOXONE HCL 0.4 MG/ML IJ SOLN: 0.4000 mg | INTRAMUSCULAR | Status: DC | PRN

## 2018-09-03 MED ORDER — KETOROLAC TROMETHAMINE 30 MG/ML IJ SOLN: 30.0000 mg | Freq: Four times a day (QID) | INTRAMUSCULAR | Status: DC | PRN

## 2018-09-03 MED ORDER — MAGNESIUM OXIDE 400 (241.3 MG) MG PO TABS
400.0000 mg | ORAL_TABLET | Freq: Every day | ORAL | Status: DC
  Administered 2018-09-04 – 2018-09-05 (×2): 400 mg via ORAL
  Filled 2018-09-03 (×3): qty 1

## 2018-09-03 MED ORDER — ONDANSETRON HCL 4 MG/2ML IJ SOLN: 4.0000 mg | Freq: Once | INTRAMUSCULAR | Status: DC | PRN

## 2018-09-03 MED ORDER — SIMETHICONE 80 MG PO CHEW
80.0000 mg | CHEWABLE_TABLET | ORAL | Status: DC | PRN
  Filled 2018-09-03: qty 1

## 2018-09-03 MED ORDER — COCONUT OIL OIL: 1.0000 "application " | TOPICAL_OIL | Status: DC | PRN

## 2018-09-03 MED ORDER — TETANUS-DIPHTH-ACELL PERTUSSIS 5-2.5-18.5 LF-MCG/0.5 IM SUSP: 0.5000 mL | Freq: Once | INTRAMUSCULAR | Status: DC

## 2018-09-03 MED ORDER — OXYCODONE HCL 5 MG PO TABS: 5.0000 mg | ORAL_TABLET | Freq: Once | ORAL | Status: DC | PRN

## 2018-09-03 MED ORDER — PRENATAL MULTIVITAMIN CH
1.0000 | ORAL_TABLET | Freq: Every day | ORAL | Status: DC
  Administered 2018-09-03 – 2018-09-05 (×3): 1 via ORAL
  Filled 2018-09-03 (×3): qty 1

## 2018-09-03 MED ORDER — OXYCODONE HCL 5 MG PO TABS: 5.0000 mg | ORAL_TABLET | ORAL | Status: DC | PRN

## 2018-09-03 MED ORDER — ACETAMINOPHEN 325 MG PO TABS: 325.0000 mg | ORAL_TABLET | ORAL | Status: DC | PRN

## 2018-09-03 NOTE — Anesthesia Postprocedure Evaluation (Signed)
Anesthesia Post Note  Patient: Norma Ramirez  Procedure(s) Performed: CESAREAN SECTION (N/A )     Patient location during evaluation: Mother Baby Anesthesia Type: Epidural Level of consciousness: awake and alert Pain management: pain level controlled Vital Signs Assessment: post-procedure vital signs reviewed and stable Respiratory status: spontaneous breathing, nonlabored ventilation and respiratory function stable Cardiovascular status: stable Postop Assessment: no headache, no backache and epidural receding Anesthetic complications: no    Last Vitals:  Vitals:   09/03/18 0055 09/03/18 0100  BP:  120/68  Pulse: (!) 101 100  Resp: 17 18  Temp:    SpO2: 97% 94%    Last Pain:  Vitals:   09/03/18 0040  TempSrc:   PainSc: 0-No pain   Pain Goal:                 Quetzal Meany

## 2018-09-03 NOTE — Brief Op Note (Signed)
09/02/2018  12:17 AM  PATIENT:  Norma Ramirez  28 y.o. female  PRE-OPERATIVE DIAGNOSIS:  CESAREAN SECTION Arrest of Dilation  POST-OPERATIVE DIAGNOSIS:  CESAREAN SECTION Arrest of Dilation   PROCEDURE:  Procedure(s): CESAREAN SECTION (N/A)  Primary LTCS, 2 layer closure  SURGEON:  Surgeon(s) and Role:    * Noland Fordyce, MD - Primary  PHYSICIAN ASSISTANT: Ivonne Andrew, CNM  ASSISTANTS: Ivonne Andrew, CNM   ANESTHESIA:   epidural  EBL:  592 mL   BLOOD ADMINISTERED:none  DRAINS: Urinary Catheter (Foley)   LOCAL MEDICATIONS USED:  NONE  SPECIMEN:  Source of Specimen:  Placenta  DISPOSITION OF SPECIMEN:  L&D  COUNTS:  YES  TOURNIQUET:  * No tourniquets in log *  DICTATION: .Note written in EPIC  PLAN OF CARE: Admit to inpatient   PATIENT DISPOSITION:  PACU - hemodynamically stable.   Delay start of Pharmacological VTE agent (>24hrs) due to surgical blood loss or risk of bleeding: yes

## 2018-09-03 NOTE — Anesthesia Postprocedure Evaluation (Signed)
Anesthesia Post Note  Patient: Norma Ramirez  Procedure(s) Performed: CESAREAN SECTION (N/A )     Patient location during evaluation: Mother Baby Anesthesia Type: Epidural Level of consciousness: awake and alert, oriented and patient cooperative Pain management: pain level controlled Vital Signs Assessment: post-procedure vital signs reviewed and stable Respiratory status: spontaneous breathing Cardiovascular status: stable Postop Assessment: no headache, epidural receding, patient able to bend at knees and no signs of nausea or vomiting Anesthetic complications: no Comments: Pain score 0.    Last Vitals:  Vitals:   09/03/18 0342 09/03/18 0442  BP: 123/63 123/63  Pulse: 85 85  Resp: 16 16  Temp: 37.1 C 37.1 C  SpO2: 96% 96%    Last Pain:  Vitals:   09/03/18 0442  TempSrc: Oral  PainSc:    Pain Goal:                 [facility]

## 2018-09-03 NOTE — Op Note (Signed)
09/02/2018  12:17 AM  PATIENT:  Norma Ramirez  28 y.o. female  PRE-OPERATIVE DIAGNOSIS:  CESAREAN SECTION Arrest of Dilation  POST-OPERATIVE DIAGNOSIS:  CESAREAN SECTION Arrest of Dilation   PROCEDURE:  Procedure(s): CESAREAN SECTION (N/A)  Primary LTCS, 2 layer closure  SURGEON:  Surgeon(s) and Role:    * Noland Fordyce, MD - Primary  PHYSICIAN ASSISTANT: Ivonne Andrew, CNM  ASSISTANTS: Ivonne Andrew, CNM   ANESTHESIA:   epidural  EBL:  592 mL   BLOOD ADMINISTERED:none  DRAINS: Urinary Catheter (Foley)   LOCAL MEDICATIONS USED:  NONE  SPECIMEN:  Source of Specimen:  Placenta  DISPOSITION OF SPECIMEN:  L&D  COUNTS:  YES  TOURNIQUET:  * No tourniquets in log *  DICTATION: .Note written in EPIC  PLAN OF CARE: Admit to inpatient   PATIENT DISPOSITION:  PACU - hemodynamically stable.   Delay start of Pharmacological VTE agent (>24hrs) due to surgical blood loss or risk of bleeding: yes    Findings:  @BABYSEXEBC @ infant,  APGAR (1 MIN): 8   APGAR (5 MINS): 8   APGAR (10 MINS):   Normal uterus, tubes and ovaries, normal placenta. 3VC, clear amniotic fluid  EBL: per nursing note Antibiotics:   2g Ancef Complications: none  Indications: This is a 28 y.o. year-old, G1  At [redacted]w[redacted]d admitted for IOL at term, elective, suspected LGA. Pt had slow entry to active labor, good progress to 9.5cm then arrest of dilation. Risks benefits and alternatives of the procedure were discussed with the patient who agreed to proceed  Procedure:  After informed consent was obtained the patient was taken to the operating room where epidural anesthesia was found to be adequate.  She was prepped and draped in the normal sterile fashion in dorsal supine position with a leftward tilt.  A foley catheter was in place.  A Pfannenstiel skin incision was made 2 cm above the pubic symphysis in the midline with the scalpel.  Dissection was carried down with the Bovie cautery until the fascia was  reached. The fascia was incised in the midline. The incision was extended laterally with the Mayo scissors. The inferior aspect of the fascial incision was grasped with the Coker clamps, elevated up and the underlying rectus muscles were dissected off sharply. The superior aspect of the fascial incision was grasped with the Coker clamps elevated up and the underlying rectus muscles were dissected off sharply.  The peritoneum was entered sharply. The peritoneal incision was extended superiorly and inferiorly with good visualization of the bladder. The bladder blade was inserted and palpation was done to assess the fetal position and the location of the uterine vessels. A bladder flap was created sharply. A RN placed her hand in the vagina and elevated the fetal head. The lower segment of the uterus was incised sharply with the scalpel and extended  bluntly in the cephalo-caudal fashion. The infant was grasped, brought to the incision,  rotated and the infant was delivered with fundal pressure. The nose and mouth were bulb suctioned. The cord was clamped and cut after 1 minute delay. The infant was handed off to the waiting pediatrician. The placenta was expressed. The uterus was exteriorized. The uterus was cleared of all clots and debris. The uterine incision was repaired with 0 Vicryl in a running locked fashion.  A second layer of the same suture was used in an imbricating fashion to obtain excellent hemostasis.  The uterus was then returned to the abdomen, the gutters were cleared of  all clots and debris. The uterine incision was reinspected and found to be hemostatic. The peritoneum was grasped and closed with 2-0 Vicryl in a running fashion. The cut muscle edges and the underside of the fascia were inspected and found to be hemostatic. The fascia was closed with 0 Vicryl in 2 halves. The subcutaneous tissue was irrigated. Scarpa's layer was closed with a 2-0 plain gut suture. The skin was closed with a 4-0  Monocryl in a single layer. The patient tolerated the procedure well. Sponge lap and needle counts were correct x3 and patient was taken to the recovery room in a stable condition.  Lendon ColonelKelly A Harvest Stanco 09/03/2018 12:20 AM

## 2018-09-03 NOTE — Progress Notes (Signed)
Put in wrong time. Supposed to be [phone]

## 2018-09-03 NOTE — Addendum Note (Signed)
Addendum  created 09/03/18 0755 by Angela Adam, CRNA   Clinical Note Signed

## 2018-09-03 NOTE — Progress Notes (Signed)
Patient ID: Norma Ramirez, female   DOB: [birth year], 28 y.o.   MRN: [id number] Subjective: POD# 1 Live born female  Birth Weight: 8 lb 3 oz (3715 g) APGAR: 8, 8  Newborn Delivery   Birth date/time:  09/02/2018 23:05:00 Delivery type:  C-Section, Low Transverse Trial of labor:  Yes C-section categorization:  Primary    Baby name: Colton Delivering provider: Noland Fordyce   circumcision planned Feeding: breast  Pain control at delivery: Epidural   Reports feeling well.  Patient reports tolerating PO.   Breast symptoms:none Pain controlled with PO meds Denies HA/SOB/C/P/N/V/dizziness. Flatus absent. She reports vaginal bleeding as normal, without clots.  Has not ambulated yet, foley cath in place.     Objective:   VS:    Vitals:   09/03/18 0214 09/03/18 0340 09/03/18 0342 09/03/18 0442  BP: 105/65 122/64 123/63 123/63  Pulse: 76 84 85 85  Resp: 16 16 16 16   Temp: 99.5 F (37.5 C) 99.1 F (37.3 C) 98.7 F (37.1 C) 98.7 F (37.1 C)  TempSrc: Axillary Oral Oral Oral  SpO2: 96% 98% 96% 96%  Weight:      Height:          Intake/Output Summary (Last 24 hours) at 09/03/2018 0924 Last data filed at 09/03/2018 4270 Gross per 24 hour  Intake 2200 ml  Output 3167 ml  Net -967 ml        Recent Labs    09/02/18 2019 09/03/18 0611  WBC 12.7* 17.1*  HGB 12.2 9.8*  HCT 36.5 29.2*  PLT 189  189 185     Blood type: --/--/B POS (01/10 1730)  Rubella: 3.08 (06/03 1154)  Vaccines: TDaP UTD         Flu    UTD   Physical Exam:  General: alert, cooperative and no distress CV: Regular rate and rhythm Resp: clear Abdomen: soft, nontender, decreased bowel sounds Incision: dry, intact and minimal drainage Uterine Fundus: firm, below umbilicus, nontender Lochia: minimal Ext: edema + 1 pedal, SCD's on      Assessment/Plan: 28 y.o.   POD# 1. G1P1001                  Principal Problem:   1C/S 1/11 - arrest of descent Active Problems:   Encounter for  elective induction of labor   Postpartum care following cesarean delivery   Maternal anemia, with delivery  - started oral Fe and Mag Ox  Doing well, stable.               Advance diet as tolerated Encourage rest when baby rests Breastfeeding support DC foley, encourage ambulation Routine post-op care  Neta Mends, CNM, MSN 09/03/2018, 9:24 AM

## 2018-09-03 NOTE — Lactation Note (Signed)
This note was copied from a baby's chart. Lactation Consultation Note  Patient Name: Norma Ramirez [id number] Date: 09/03/2018 Reason for consult: Initial assessment;Primapara;Term Newborn is 33 hours old.  Mom reports baby is latching with ease.  She has been receiving assist from the nurses.  Using cross cradle hold.  Baby is currently awake and showing early feeding cues.  Assisted with positioning baby in football hold on right breast.  Nipples erect.  Baby opened wide and latched easily.  Observed active feeding for 10 minutes and baby continued to feed when I left.  Reviewed basics and answered questions.  Breastfeeding consultation services and support information given and reviewed.  Maternal Data Has patient been taught Hand Expression?: Yes Does the patient have breastfeeding experience prior to this delivery?: No  Feeding Feeding Type: Breast Fed  LATCH Score Latch: Grasps breast easily, tongue down, lips flanged, rhythmical sucking.  Audible Swallowing: A few with stimulation  Type of Nipple: Everted at rest and after stimulation  Comfort (Breast/Nipple): Soft / non-tender  Hold (Positioning): Assistance needed to correctly position infant at breast and maintain latch.  LATCH Score: 8  Interventions Interventions: Breast feeding basics reviewed;Assisted with latch;Breast compression;Skin to skin;Adjust position;Breast massage;Support pillows;Position options  Lactation Tools Discussed/Used     Consult Status Consult Status: Follow-up Date: 09/04/18 Follow-up type: In-patient    Huston Foley 09/03/2018, 12:02 PM

## 2018-09-04 NOTE — Progress Notes (Signed)
POSTOPERATIVE DAY # 2 S/P CS - arrest of dilation  S:         Reports feeling ok - some cramps and soreness with walking             Tolerating po intake / no nausea / no vomiting / + flatus / no BM             Bleeding is spotting             Pain controlled with motrin and oxycodone             Up ad lib / ambulatory/ voiding QS  Newborn Breast  - baby under phototherapy in room  O:  VS: BP 104/61 (BP Location: Left Arm)   Pulse 81   Temp 97.8 F (36.6 C) (Oral)   Resp 16   Ht 5\' 4"  (1.626 m)   Wt 91.6 kg   LMP  (LMP Unknown)   SpO2 98%   Breastfeeding Unknown   BMI 34.67 kg/m    LABS:              Recent Labs    09/02/18 2019 09/03/18 0611  WBC 12.7* 17.1*  HGB 12.2 9.8*  PLT 189  189 185               Bloodtype: --/--/B POS (01/10 1730)  Rubella: 3.08 (06/03 1154)                                            I&O: Intake/Output      01/12 0701 - 01/13 0700 01/13 0701 - 01/14 0700   I.V. (mL/kg)     Total Intake(mL/kg)     Urine (mL/kg/hr) 1000 (0.5)    Blood     Total Output 1000    Net -1000                    Physical Exam:             Alert and Oriented X3  Lungs: Clear and unlabored  Heart: regular rate and rhythm / no mumurs  Abdomen: soft, non-tender, mildly distended, active BS             Fundus: firm, non-tender, Ueven             Dressing intact              Incision:  approximated with suture / no erythema / no ecchymosis / marked dried drainage  Perineum: intact  Lochia: light  Extremities: trace edema, no calf pain or tenderness  A:        POD # 2 S/P CS            ABL anemia             P:        Routine postoperative care              Anticipate dc tomorrow - work with lactation on feedings today   Marlinda Mikeanya Farley Crooker CNM, MSN, [facility] 09/04/2018, 8:56 AM

## 2018-09-05 MED ORDER — IBUPROFEN 800 MG PO TABS: 800.0000 mg | ORAL_TABLET | Freq: Three times a day (TID) | ORAL | 0 refills | Status: AC

## 2018-09-05 MED ORDER — SENNOSIDES-DOCUSATE SODIUM 8.6-50 MG PO TABS: 2.0000 | ORAL_TABLET | ORAL | 0 refills | Status: AC

## 2018-09-05 MED ORDER — POLYSACCHARIDE IRON COMPLEX 150 MG PO CAPS: 150.0000 mg | ORAL_CAPSULE | Freq: Every day | ORAL | 2 refills | Status: AC

## 2018-09-05 NOTE — Lactation Note (Signed)
This note was copied from a baby's chart. Lactation Consultation Note  Patient Name: Norma Ramirez [id number] Date: 09/05/2018 Reason for consult: Follow-up assessment;Hyperbilirubinemia;Difficult latch;Primapara Mom states baby won't latch so formula feeding.  Baby just finished a feeding.  Stressed importance of pumping every 3 hours to establish and maintain milk supply.  Mom has a pump at home.  Reviewed and encouraged outpatient lactation services and support.  Maternal Data    Feeding Feeding Type: Formula Nipple Type: Slow - flow  LATCH Score                   Interventions    Lactation Tools Discussed/Used     Consult Status      Huston Foley 09/05/2018, 8:56 AM

## 2018-09-05 NOTE — Progress Notes (Signed)
POSTOPERATIVE DAY # 3 S/P Primary LTCS for arrest of dilation    S:         Reports feeling good, ready to be discharged home              Tolerating po intake / no nausea / no vomiting / + flatus / no BM  Denies dizziness, SOB, or CP             Bleeding is light             Pain controlled with Motrin             Up ad lib / ambulatory/ voiding QS  Newborn breast feeding with formula supplementation / Circumcision - completed 1/13   O:  VS: BP (!) 108/51 (BP Location: Left Arm)   Pulse 72   Temp (!) 97.5 F (36.4 C) (Oral)   Resp 18   Ht 5\' 4"  (1.626 m)   Wt 91.6 kg   LMP  (LMP Unknown)   SpO2 98%   Breastfeeding Unknown   BMI 34.67 kg/m    LABS:               Recent Labs    09/02/18 2019 09/03/18 0611  WBC 12.7* 17.1*  HGB 12.2 9.8*  PLT 189  189 185               Bloodtype: --/--/B POS (01/10 1730)  Rubella: 3.08 (06/03 1154)                                             I&O: Intake/Output      01/13 0701 - 01/14 0700 01/14 0701 - 01/15 0700   Urine (mL/kg/hr)     Total Output     Net                       Physical Exam:             Alert and Oriented X3  Lungs: Clear and unlabored  Heart: regular rate and rhythm / no murmurs  Abdomen: soft, non-tender, non-distended, active bowel sounds in all quadrants              Fundus: firm, appropriate tenderness on palpation - non-tender at rest, U-2             Dressing: honeycomb with steri-strips c/d/i, small shadowing marked and dried             Incision:  approximated with sutures / no erythema / no ecchymosis / no drainage  Perineum: intact  Lochia: small, no clots   Extremities: noedema, no calf pain or tenderness,   A:        POD # 3  S/P Primary LTCS             ABL Anemia   P:        Routine postoperative care              Discharge home today  WOB discharge book, instructions, and warning s/s reviewed   F/u with Dr. Ernestina Penna in 6 weeks   Carlean Jews, MSN, CNM [facility]

## 2018-09-05 NOTE — Discharge Summary (Signed)
Obstetric Discharge Summary   Patient Name: Norma Ramirez DOB: 15-Dec-1990 MRN: [id number]  Date of Admission: 09/01/2018 Date of Discharge: 09/05/2018 Date of Delivery: 09/02/2018 Gestational Age at Delivery: [redacted]w[redacted]d  Primary OB: [facility] - Dr. Ernestina Penna since 22 weeks  Antepartum complications:  - Normal early PN care at [facility] - Increased AFP for ONTD, nl u/s, test not repeated - DS 134 - GBS neg Prenatal Labs:  ABO, Rh:B/Positive/-- (06/03 1154) Antibody:Negative (06/03 1154) Rubella:3.08 (06/03 1154) RPR:Non Reactive (06/03 1154) HBsAg:Negative (06/03 1154) HIV:Non Reactive (06/03 1154) GUY:QIHKVQQV (12/03 0000) 1 hr [id number] Genetic screeningnormal NT Anatomy USnormal  Admitting Diagnosis: IOL for postdates  Secondary Diagnoses: Patient Active Problem List   Diagnosis Date Noted  . 1C/S 1/11 - arrest of descent 09/03/2018  . Postpartum care following cesarean delivery (1/11) 09/03/2018  . Maternal anemia, with delivery 09/03/2018  . Encounter for elective induction of labor 09/01/2018   Induction: Foley bulb, cytotec  Augmentation: Pitocin Complications: Arrest of dilation  Date of Delivery: 09/02/2018 Delivered By: Dr. Retia Passe CNM assist Delivery Type: primary cesarean section, low transverse incision  Newborn Data: Live born female  Birth Weight: 8 lb 3 oz (3715 g) APGAR: 8, 8  Newborn Delivery   Birth date/time:  09/02/2018 23:05:00 Delivery type:  C-Section, Low Transverse Trial of labor:  Yes C-section categorization:  Primary     Hospital/Postpartum Course  (Cesarean Section):  Pt. Admitted for postdates induction of labor.  See notes for details. She did not progress past 9cm.  See notes and OP note for details. Patient had an uncomplicated postpartum course.  By time of discharge on POD#3, her pain was controlled on oral pain medications; she had appropriate lochia and was ambulating, voiding without  difficulty, tolerating regular diet and passing flatus.   She was deemed stable for discharge to home.     Labs: CBC Latest Ref Rng & Units 09/03/2018 09/02/2018 09/02/2018  WBC 4.0 - 10.5 K/uL 17.1(H) - 12.7(H)  Hemoglobin 12.0 - 15.0 g/dL 3.2(R) - 51.8  Hematocrit 36.0 - 46.0 % 29.2(L) - 36.5  Platelets 150 - 400 K/uL 185 189 189   B POS  Physical exam:  BP (!) 108/51 (BP Location: Left Arm)   Pulse 72   Temp (!) 97.5 F (36.4 C) (Oral)   Resp 18   Ht 5\' 4"  (1.626 m)   Wt 91.6 kg   LMP  (LMP Unknown)   SpO2 98%   Breastfeeding Unknown   BMI 34.67 kg/m  General: alert and no distress Pulm: normal respiratory effort Lochia: appropriate Abdomen: soft, NT Uterine Fundus: firm, below umbilicus Perineum: healing well, no significant erythema, no significant edema Incision: c/d/i, healing well, no significant drainage, no dehiscence, no significant erythema Extremities: No evidence of DVT seen on physical exam. No lower extremity edema.   Disposition: stable, discharge to home Baby Feeding: breast milk and formula Baby Disposition: home with mom  Contraception: discussed, but undecided  Rh Immune globulin given: N/A Rubella vaccine given: N/A Tdap vaccine given in AP or PP setting: Declined Flu vaccine given in AP or PP setting: Declined   Plan:  Penni Homans was discharged to home in good condition. Follow-up appointment at [facility] in 6 weeks.  Discharge Instructions: Per After Visit Summary. Refer to After Visit Summary and [facility] discharge booklet  Activity: Advance as tolerated. Pelvic rest for 6 weeks.   Diet: Regular, Heart Healthy Discharge Medications: Allergies as of 09/05/2018  No Known Allergies     Medication List    TAKE these medications   ibuprofen 800 MG tablet Commonly known as:  ADVIL,MOTRIN Take 1 tablet (800 mg total) by mouth every 8 (eight) hours.   iron polysaccharides 150 MG capsule Commonly known as:   NIFEREX Take 1 capsule (150 mg total) by mouth daily. Start taking on:  September 06, 2018   PRENATAL PLUS IRON 29-1 MG Tabs Take 1 tablet by mouth daily.   senna-docusate 8.6-50 MG tablet Commonly known as:  Senokot-S Take 2 tablets by mouth daily. Start taking on:  September 06, 2018      Outpatient follow up:  Follow-up Information    Noland Fordyce, MD. Schedule an appointment as soon as possible for a visit in 6 week(s).   Specialty:  Obstetrics and Gynecology Why:  Postpartum visit Contact information: [address] [place] [address] [phone]           Signed:  Carlean Jews, MSN, CNM [facility]

## 2021-08-05 ENCOUNTER — Other Ambulatory Visit: Payer: Self-pay | Admitting: Obstetrics

## 2021-08-05 DIAGNOSIS — R1900 Intra-abdominal and pelvic swelling, mass and lump, unspecified site: Secondary | ICD-10-CM

## 2021-09-19 ENCOUNTER — Ambulatory Visit
Admission: RE | Admit: 2021-09-19 | Discharge: 2021-09-19 | Disposition: A | Discharge status: Home / Self Care | Payer: Managed Care, Other (non HMO) | Source: Ambulatory Visit | Attending: Obstetrics | Admitting: Obstetrics

## 2021-09-19 ENCOUNTER — Other Ambulatory Visit: Payer: Self-pay

## 2021-09-19 DIAGNOSIS — R1900 Intra-abdominal and pelvic swelling, mass and lump, unspecified site: Secondary | ICD-10-CM

## 2021-09-19 IMAGING — MR MR PELVIS WO/W CM
4 of 6 series · 34 of 48 positions shown · IV contrast (19 ml multihance)
Comparison: None

CLINICAL DATA: Pelvic mass on prior outside imaging.

EXAM:
MRI ABDOMEN AND PELVIS WITHOUT AND WITH CONTRAST
TECHNIQUE: Multiplanar multisequence MR imaging of the abdomen and pelvis was
performed both before and after the administration of intravenous
contrast.
CONTRAST:  19mL MULTIHANCE GADOBENATE DIMEGLUMINE 529 MG/ML IV SOLN

[Series 3: cor haste · coronal · 4.5mm · 0.78mm/px · 11 of 36 slices shown]
[im 1/36]
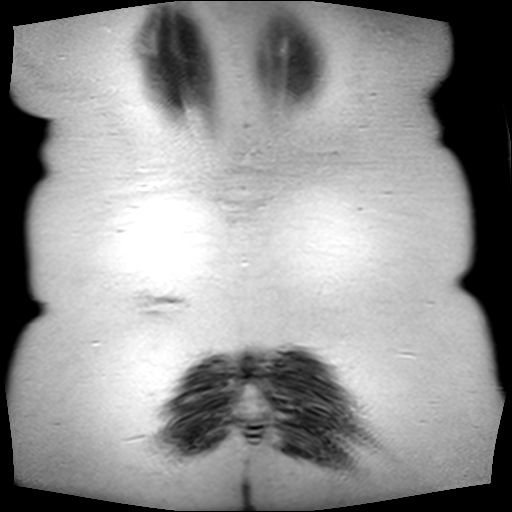
[im 4/36]
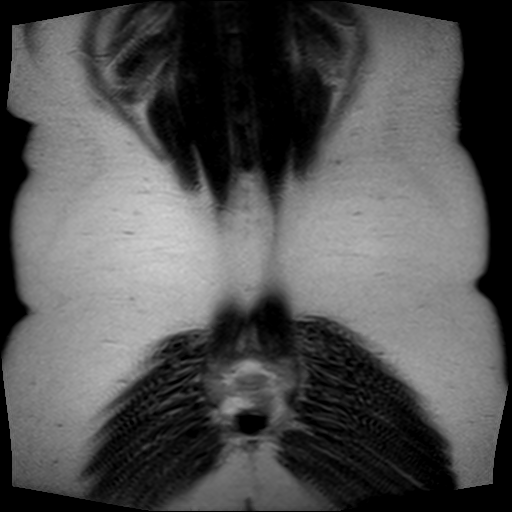
[im 8/36]
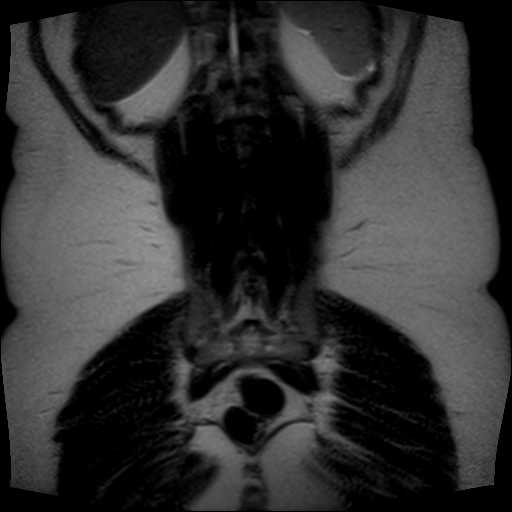
[im 11/36]
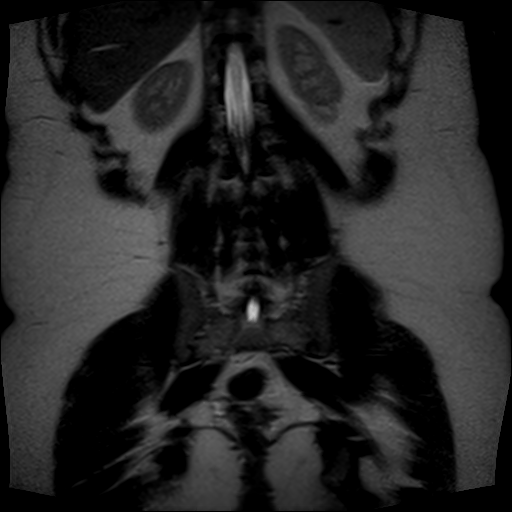
[im 15/36]
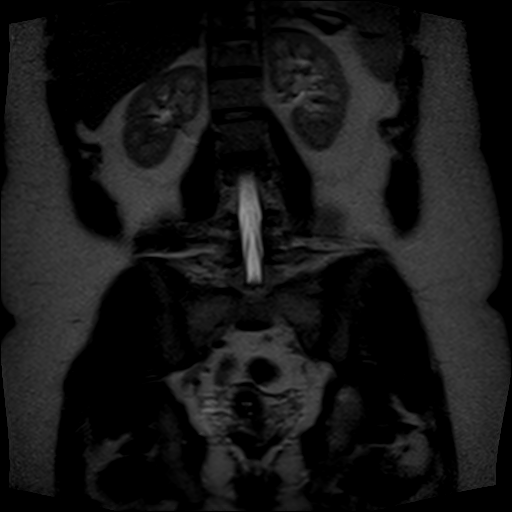
[im 18/36]
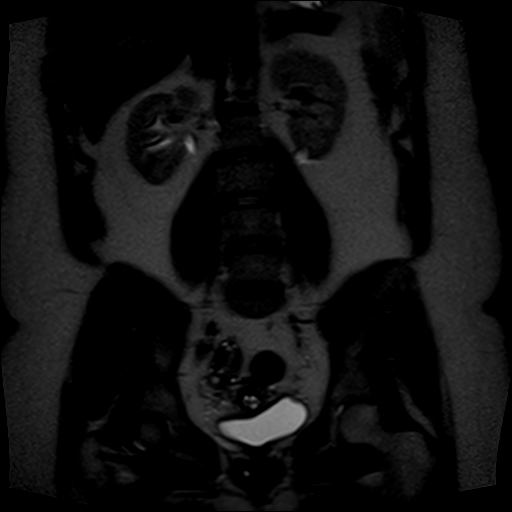
[im 22/36]
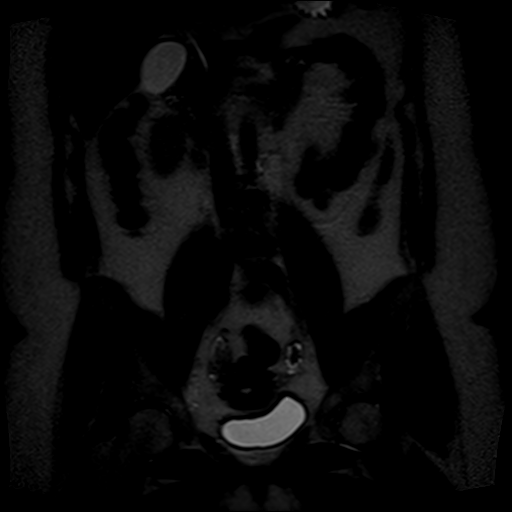
[im 25/36]
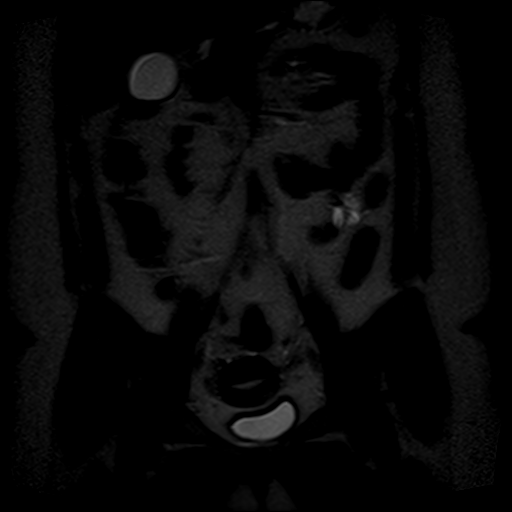
[im 29/36]
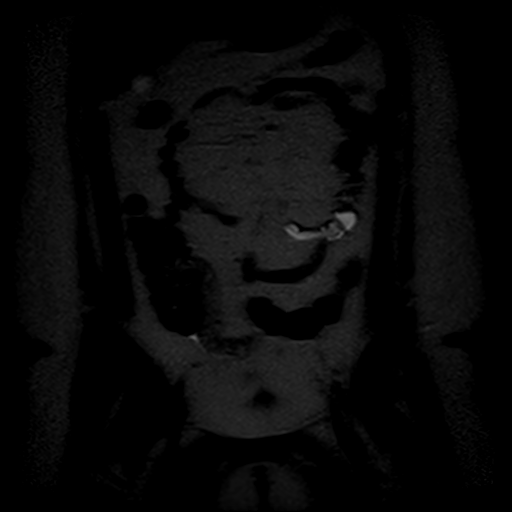
[im 32/36]
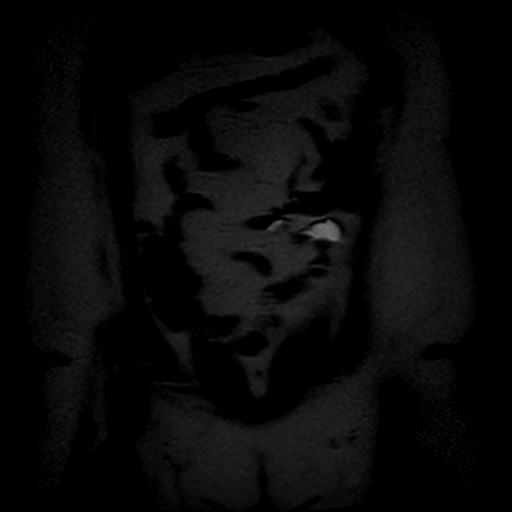
[im 36/36]
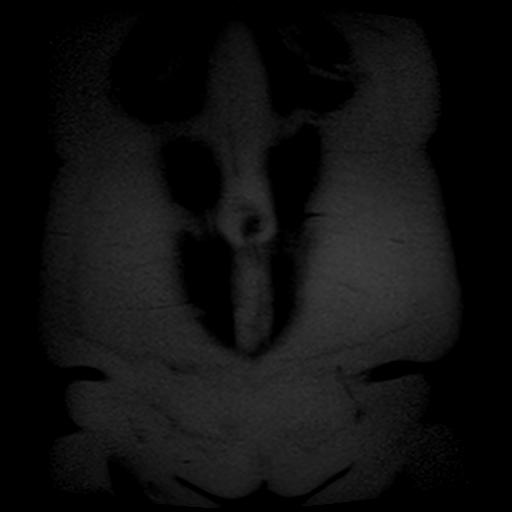

[Series 4: t2_tse_sag · sagittal · 5.0mm · 0.98mm/px · 9 of 29 slices shown]
[im 1/29]
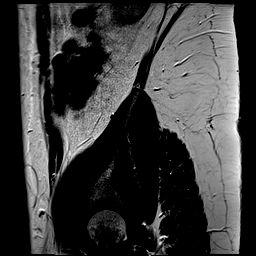
[im 4/29]
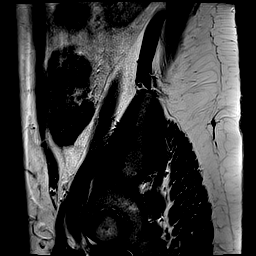
[im 8/29]
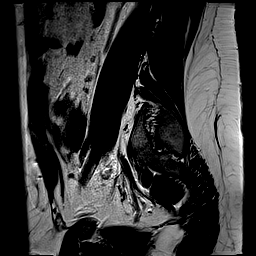
[im 11/29]
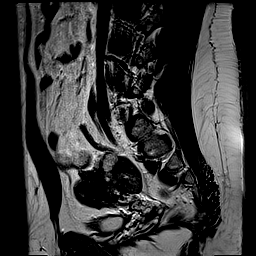
[im 15/29]
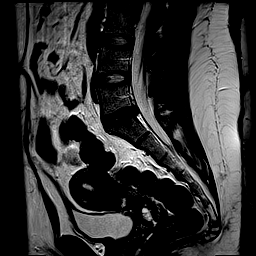
[im 18/29]
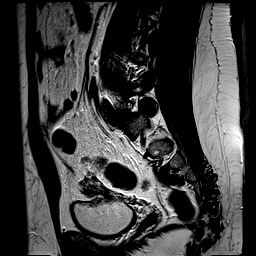
[im 22/29]
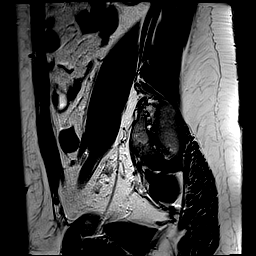
[im 25/29]
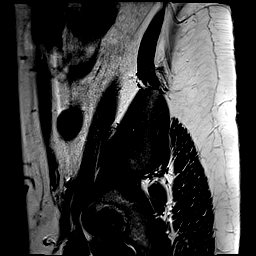
[im 29/29]
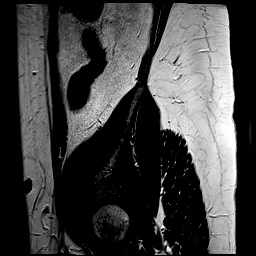

[Series 5: t2_tse axial · axial · 6.0mm · 0.98mm/px · z∈[-102,+93]mm · 7 of 26 slices shown]
[im 1/26]
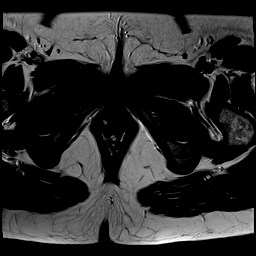
[im 5/26]
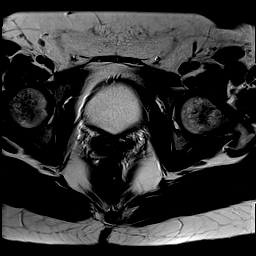
[im 9/26]
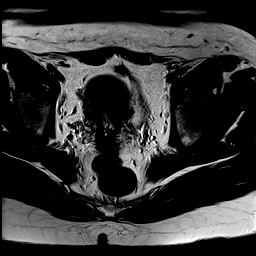
[im 13/26]
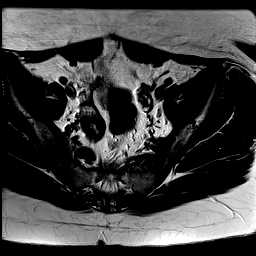
[im 17/26]
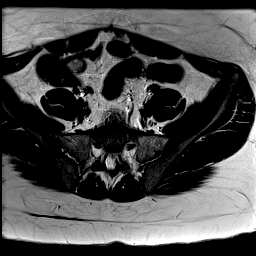
[im 21/26]
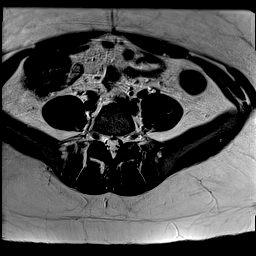
[im 26/26]
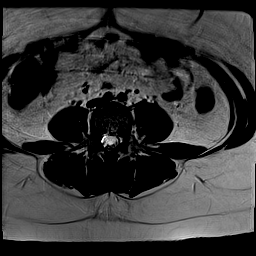

[Series 6: axial spgr · axial · 6.0mm · 0.94mm/px · z∈[-102,+93]mm · 7 of 26 slices shown]
[im 1/26]
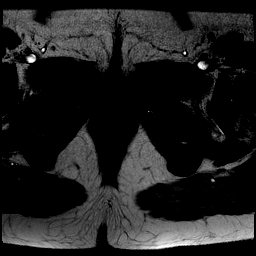
[im 5/26]
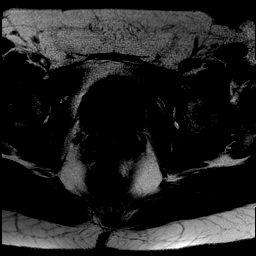
[im 9/26]
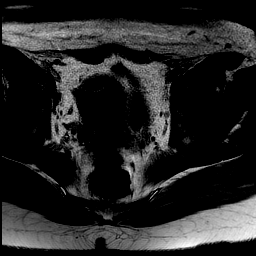
[im 13/26]
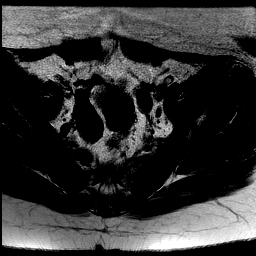
[im 17/26]
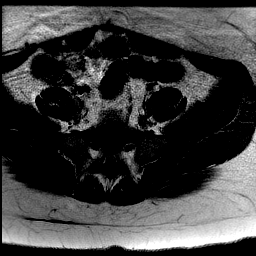
[im 21/26]
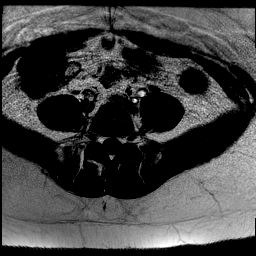
[im 26/26]
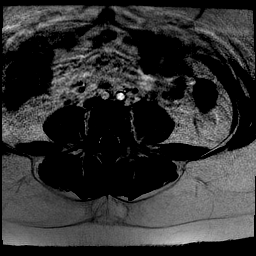

[34 of 48 positions shown; findings below may reference images not displayed]

FINDINGS: COMBINED FINDINGS FOR BOTH MR ABDOMEN AND PELVIS

Lower Chest: No acute findings.

Hepatobiliary: No hepatic masses identified. Moderate to severe
diffuse hepatic steatosis is demonstrated by chemical shift imaging.
Gallbladder is unremarkable. No evidence of biliary ductal
dilatation.

Pancreas:  No mass or inflammatory changes.

Spleen: Within normal limits in size and appearance.

Adrenals/Urinary Tract: No masses identified. Tiny subcapsular cyst
noted in lower pole of left kidney. No evidence of hydronephrosis.

Stomach/Bowel: No evidence of obstruction, inflammatory process or
abnormal fluid collections.

Vascular/Lymphatic: No pathologically enlarged lymph nodes. No acute
vascular findings.

Reproductive:

-- Uterus: Measures 9.7 x 4.5 by 4.8 cm (volume = 110 cm^3). No
fibroids or other masses identified. Endometrial thickness measures
7 mm. Cervix and vagina are unremarkable.

-- Right ovary: A small rounded area is seen measuring 2.8 x 2.4 cm
which shows T1 isointensity, mild heterogeneous T2 hypointensity,
mild contrast enhancement. This may represent an atypical involuting
corpus luteum, although a small ovarian mass cannot definitely be
excluded.

-- Left ovary: Appears normal. No mass or inflammatory process
identified.

Other:  None.

Musculoskeletal:  No suspicious bone lesions identified.
IMPRESSION: 2.8 cm rounded area of signal abnormality in the right ovary, which
may represent an atypical involuting corpus luteum, although a small
ovarian mass cannot definitely be excluded. Recommend follow-up by
ultrasound in 6-8 weeks.

No other masses or lymphadenopathy identified within the abdomen or
pelvis.

Moderate to severe diffuse hepatic steatosis.

## 2021-09-19 IMAGING — MR MR ABDOMEN WO/W CM
12 of 17 series · 27 of 48 positions shown · IV contrast (multihance)
Comparison: None

CLINICAL DATA: Pelvic mass on prior outside imaging.

EXAM:
MRI ABDOMEN AND PELVIS WITHOUT AND WITH CONTRAST
TECHNIQUE: Multiplanar multisequence MR imaging of the abdomen and pelvis was
performed both before and after the administration of intravenous
contrast.
CONTRAST:  19mL MULTIHANCE GADOBENATE DIMEGLUMINE 529 MG/ML IV SOLN

[Series 3: cor haste · coronal · 4.5mm · 0.74mm/px · 1 of 36 slices shown]
[im 1/36]
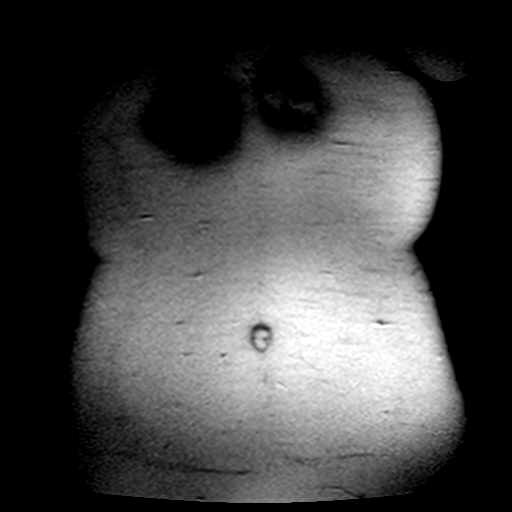

[Series 4: bSSFP · axial · 6.0mm · 0.74mm/px · 1 of 35 slices shown]
[im 1/35]
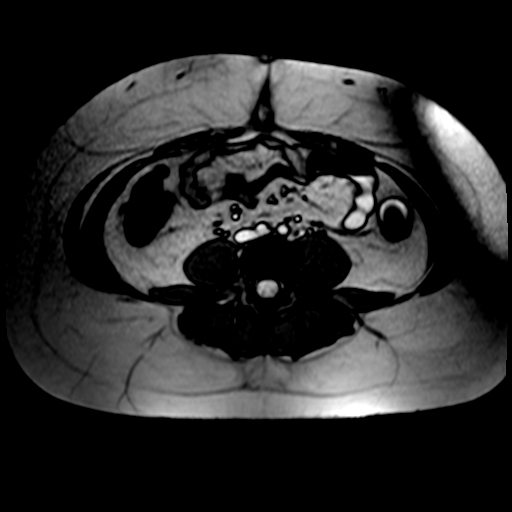

[Series 5: T1 · axial · 6.0mm · 0.74mm/px · z∈[+86,+317]mm · 2 of 72 slices shown]
[im 1/72]
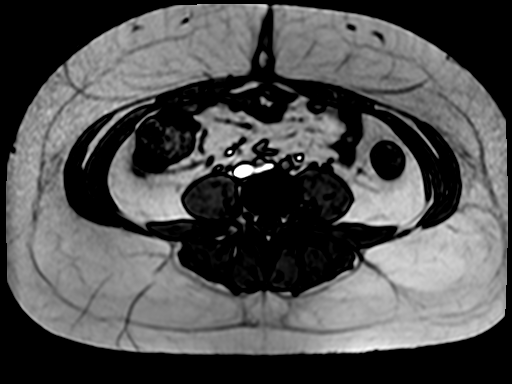
[im 72/72]
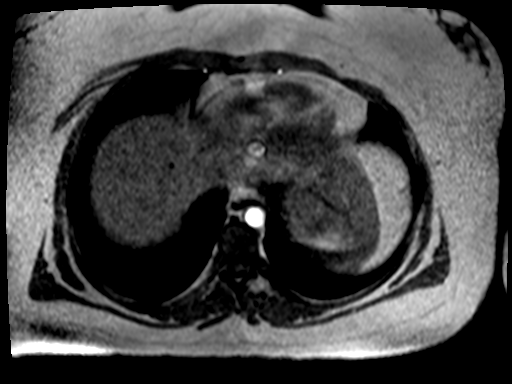

[Series 6: axial haste · axial · 6.0mm · 0.74mm/px · 1 of 35 slices shown]
[im 1/35]
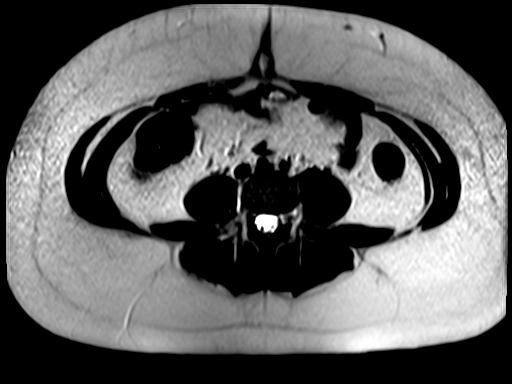

[Series 7: T2 · axial · 6.0mm · 1.12mm/px · 1 of 41 slices shown]
[im 1/41]
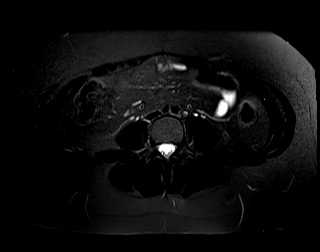

[Series 8: ep2d_[id number]_500_800_p2_trig · axial · 6.0mm · 1.98mm/px · z∈[+98,+362]mm · 4 of 123 slices shown]
[im 1/123]
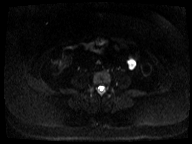
[im 41/123]
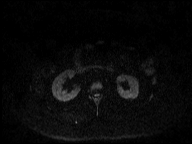
[im 82/123]
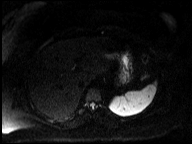
[im 123/123]
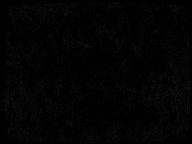

[Series 9: ep2d_diff_b50_500_800_p2_trig_adc · axial · 6.0mm · 1.98mm/px · 1 of 41 slices shown]
[im 1/41]
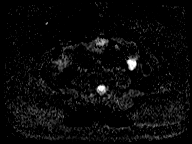

[Series 10: T1 dynamic · axial · non-contrast · 2.5mm · 0.74mm/px · z∈[+86,+324]mm · 3 of 96 slices shown]
[im 1/96]
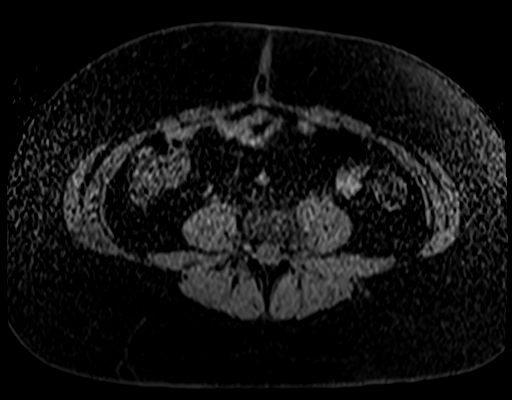
[im 48/96]
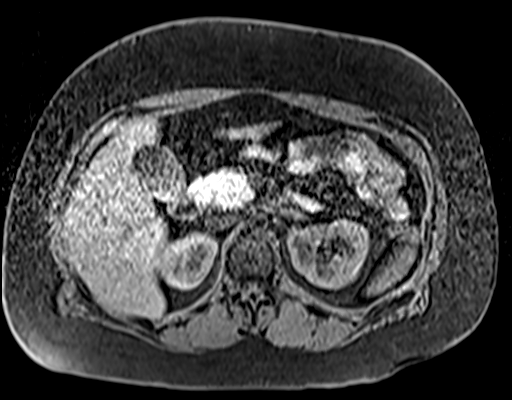
[im 96/96]
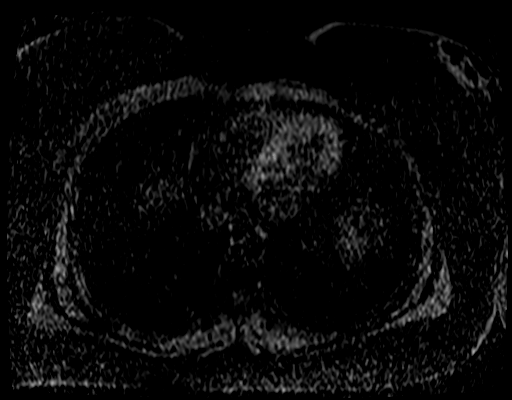

[Series 11: T1 dynamic post-contrast · axial · 2.5mm · 0.74mm/px · z∈[+86,+324]mm · 4 of 96 slices shown (1 of 4)]
[im 1/96]
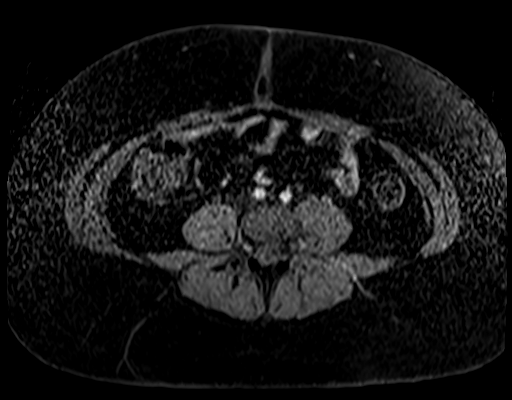
[im 32/96]
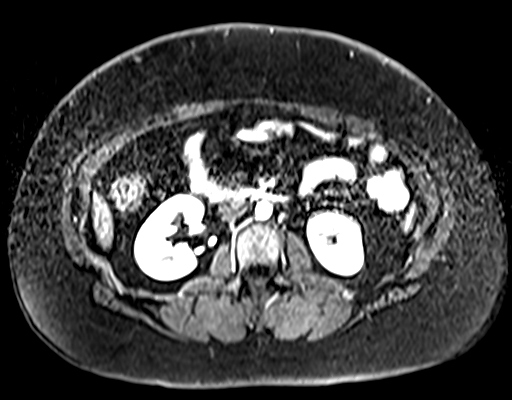
[im 64/96]
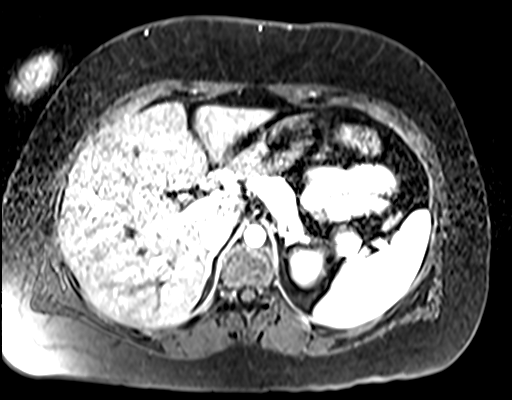
[im 96/96]
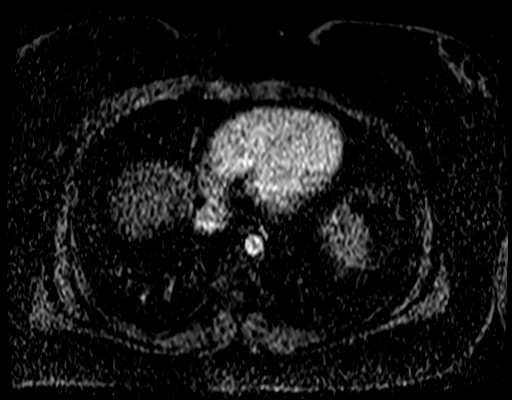

[Series 12: T1 dynamic post-contrast · axial · 2.5mm · 0.74mm/px · z∈[+86,+324]mm · 4 of 96 slices shown (2 of 4)]
[im 1/96]
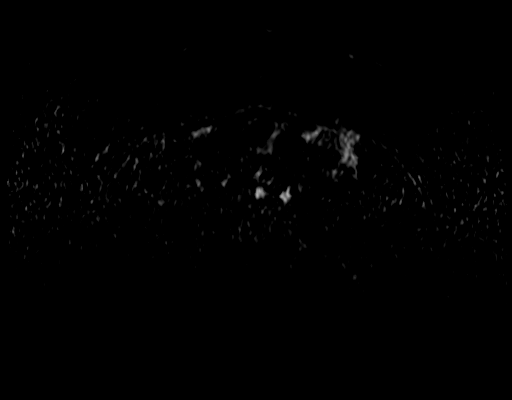
[im 32/96]
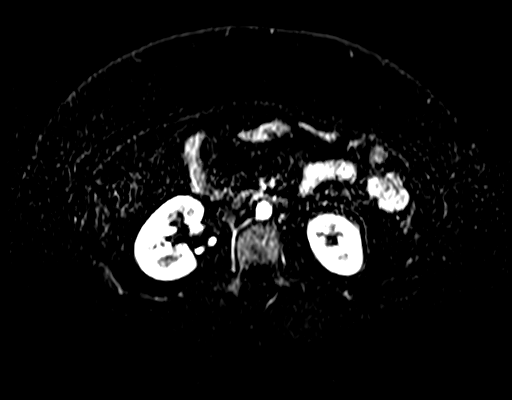
[im 64/96]
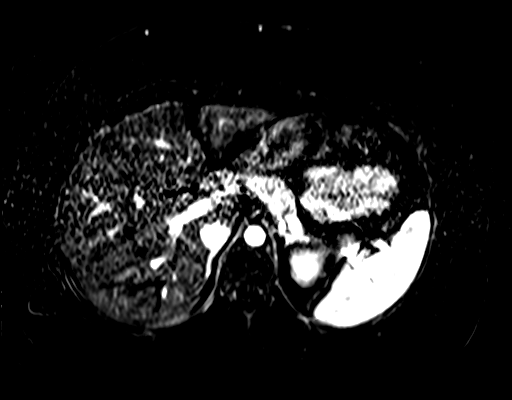
[im 96/96]
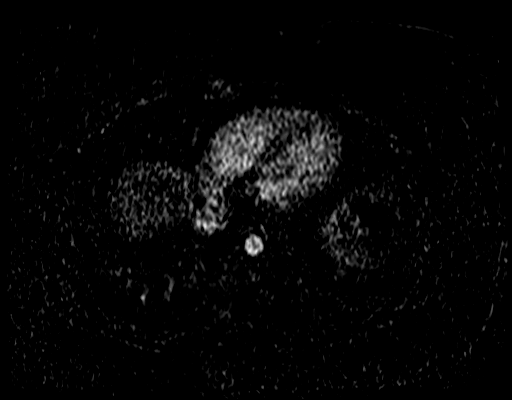

[Series 13: T1 dynamic post-contrast · axial · 2.5mm · 0.74mm/px · z∈[+86,+324]mm · 4 of 96 slices shown (3 of 4)]
[im 1/96]
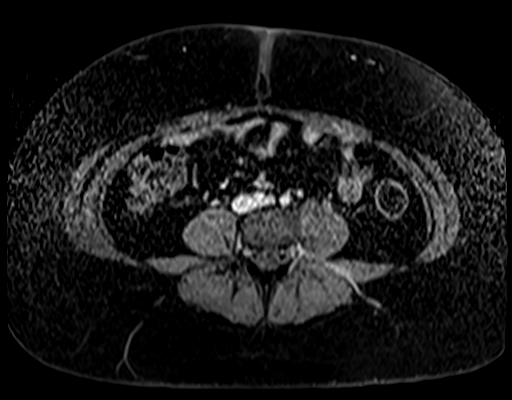
[im 32/96]
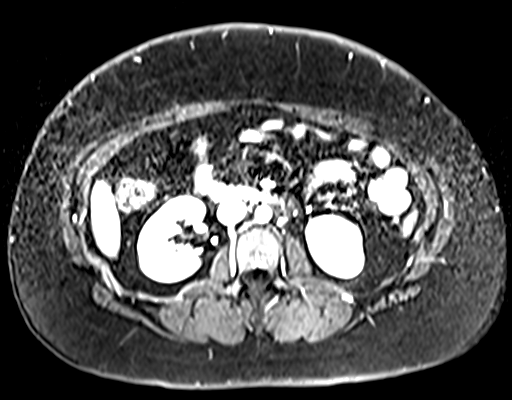
[im 64/96]
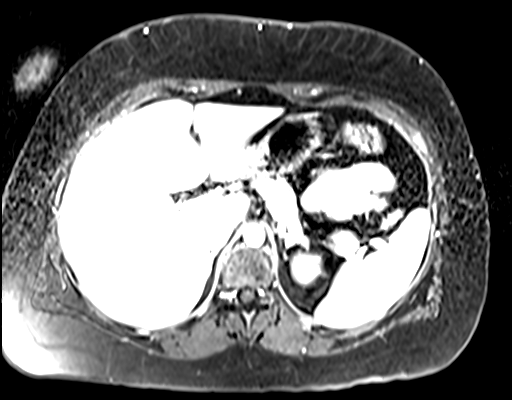
[im 96/96]
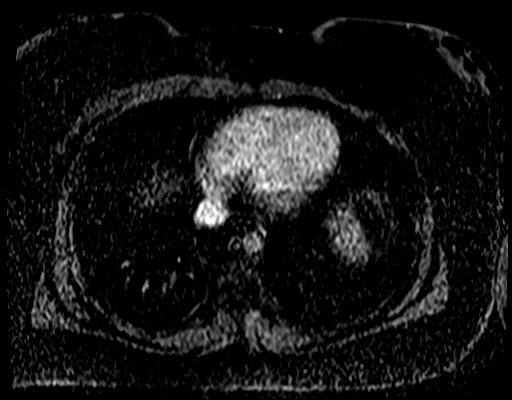

[Series 14: T1 dynamic post-contrast · axial · 2.5mm · 0.74mm/px · 1 of 96 slices shown (4 of 4)]
[im 1/96]
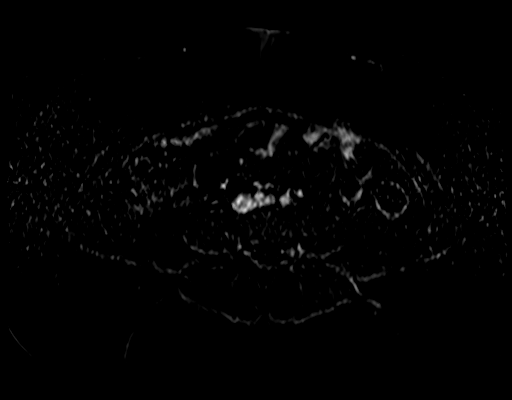

[27 of 48 positions shown; findings below may reference images not displayed]

FINDINGS: COMBINED FINDINGS FOR BOTH MR ABDOMEN AND PELVIS

Lower Chest: No acute findings.

Hepatobiliary: No hepatic masses identified. Moderate to severe
diffuse hepatic steatosis is demonstrated by chemical shift imaging.
Gallbladder is unremarkable. No evidence of biliary ductal
dilatation.

Pancreas:  No mass or inflammatory changes.

Spleen: Within normal limits in size and appearance.

Adrenals/Urinary Tract: No masses identified. Tiny subcapsular cyst
noted in lower pole of left kidney. No evidence of hydronephrosis.

Stomach/Bowel: No evidence of obstruction, inflammatory process or
abnormal fluid collections.

Vascular/Lymphatic: No pathologically enlarged lymph nodes. No acute
vascular findings.

Reproductive:

-- Uterus: Measures 9.7 x 4.5 by 4.8 cm (volume = 110 cm^3). No
fibroids or other masses identified. Endometrial thickness measures
7 mm. Cervix and vagina are unremarkable.

-- Right ovary: A small rounded area is seen measuring 2.8 x 2.4 cm
which shows T1 isointensity, mild heterogeneous T2 hypointensity,
mild contrast enhancement. This may represent an atypical involuting
corpus luteum, although a small ovarian mass cannot definitely be
excluded.

-- Left ovary: Appears normal. No mass or inflammatory process
identified.

Other:  None.

Musculoskeletal:  No suspicious bone lesions identified.
IMPRESSION: 2.8 cm rounded area of signal abnormality in the right ovary, which
may represent an atypical involuting corpus luteum, although a small
ovarian mass cannot definitely be excluded. Recommend follow-up by
ultrasound in 6-8 weeks.

No other masses or lymphadenopathy identified within the abdomen or
pelvis.

Moderate to severe diffuse hepatic steatosis.

## 2021-09-19 MED ORDER — GADOBENATE DIMEGLUMINE 529 MG/ML IV SOLN
19.0000 mL | Freq: Once | INTRAVENOUS | Status: AC | PRN
  Administered 2021-09-19: 19 mL via INTRAVENOUS
